# Patient Record
Sex: Female | Born: 1955 | Race: White | Hispanic: No | Marital: Married | Smoking: Former smoker
Health system: Southern US, Community
[De-identification: ages and names within clinical notes are randomized; demographics above are authoritative.]

## PROBLEM LIST (undated history)

## (undated) DIAGNOSIS — E7800 Pure hypercholesterolemia, unspecified: Secondary | ICD-10-CM

## (undated) DIAGNOSIS — I10 Essential (primary) hypertension: Secondary | ICD-10-CM

## (undated) HISTORY — PX: ABDOMINAL HYSTERECTOMY: SHX81

## (undated) HISTORY — PX: VAGINAL HYSTERECTOMY: SUR661

---

## 2003-04-09 ENCOUNTER — Other Ambulatory Visit: Admission: RE | Admit: 2003-04-09 | Discharge: 2003-04-09 | Payer: Self-pay | Admitting: Obstetrics and Gynecology

## 2006-02-07 ENCOUNTER — Ambulatory Visit (HOSPITAL_COMMUNITY): Admission: RE | Admit: 2006-02-07 | Discharge: 2006-02-08 | Payer: Self-pay | Admitting: Obstetrics and Gynecology

## 2011-11-10 ENCOUNTER — Other Ambulatory Visit: Payer: Self-pay | Admitting: Family Medicine

## 2011-11-10 DIAGNOSIS — Z1231 Encounter for screening mammogram for malignant neoplasm of breast: Secondary | ICD-10-CM

## 2011-11-10 DIAGNOSIS — Z780 Asymptomatic menopausal state: Secondary | ICD-10-CM

## 2011-12-05 ENCOUNTER — Other Ambulatory Visit: Payer: Self-pay

## 2011-12-05 ENCOUNTER — Ambulatory Visit: Payer: Self-pay

## 2011-12-11 ENCOUNTER — Ambulatory Visit
Admission: RE | Admit: 2011-12-11 | Discharge: 2011-12-11 | Disposition: A | Discharge status: Home / Self Care | Payer: 59 | Source: Ambulatory Visit | Attending: Family Medicine | Admitting: Family Medicine

## 2011-12-11 DIAGNOSIS — Z780 Asymptomatic menopausal state: Secondary | ICD-10-CM

## 2011-12-11 DIAGNOSIS — Z1231 Encounter for screening mammogram for malignant neoplasm of breast: Secondary | ICD-10-CM

## 2013-04-22 ENCOUNTER — Other Ambulatory Visit (HOSPITAL_COMMUNITY): Payer: Self-pay | Admitting: Cardiology

## 2013-04-22 DIAGNOSIS — R079 Chest pain, unspecified: Secondary | ICD-10-CM

## 2013-04-28 ENCOUNTER — Encounter (HOSPITAL_COMMUNITY)
Admission: RE | Admit: 2013-04-28 | Discharge: 2013-04-28 | Disposition: A | Discharge status: Home / Self Care | Payer: 59 | Source: Ambulatory Visit | Attending: Cardiology | Admitting: Cardiology

## 2013-04-28 ENCOUNTER — Other Ambulatory Visit: Payer: Self-pay

## 2013-04-28 DIAGNOSIS — R079 Chest pain, unspecified: Secondary | ICD-10-CM | POA: Insufficient documentation

## 2013-04-28 MED ORDER — TECHNETIUM TC 99M SESTAMIBI GENERIC - CARDIOLITE: 10.0000 | Freq: Once | INTRAVENOUS | Status: AC | PRN

## 2013-04-28 MED ORDER — TECHNETIUM TC 99M SESTAMIBI GENERIC - CARDIOLITE
10.0000 | Freq: Once | INTRAVENOUS | Status: AC | PRN
  Administered 2013-04-28: 10 via INTRAVENOUS

## 2013-04-28 MED ORDER — TECHNETIUM TC 99M SESTAMIBI GENERIC - CARDIOLITE
30.0000 | Freq: Once | INTRAVENOUS | Status: AC | PRN
  Administered 2013-04-28: 30 via INTRAVENOUS

## 2014-07-28 ENCOUNTER — Ambulatory Visit (INDEPENDENT_AMBULATORY_CARE_PROVIDER_SITE_OTHER): Payer: PRIVATE HEALTH INSURANCE | Admitting: Podiatry

## 2014-07-28 ENCOUNTER — Encounter: Payer: Self-pay | Admitting: Podiatry

## 2014-07-28 ENCOUNTER — Ambulatory Visit (INDEPENDENT_AMBULATORY_CARE_PROVIDER_SITE_OTHER): Payer: PRIVATE HEALTH INSURANCE

## 2014-07-28 VITALS — BP 182/101 | HR 90 | Resp 163 | Ht 67.0 in | Wt 137.0 lb

## 2014-07-28 DIAGNOSIS — L603 Nail dystrophy: Secondary | ICD-10-CM

## 2014-07-28 DIAGNOSIS — M2010 Hallux valgus (acquired), unspecified foot: Secondary | ICD-10-CM

## 2014-07-28 DIAGNOSIS — L608 Other nail disorders: Secondary | ICD-10-CM | POA: Diagnosis not present

## 2014-07-28 NOTE — Patient Instructions (Signed)
Bunion (Hallux Valgus) A bony bump (protrusion) on the inside of the foot, at the base of the first toe, is called a bunion (hallux valgus). A bunion causes the first toe to angle toward the other toes. SYMPTOMS   A bony bump on the inside of the foot, causing an outward turning of the first toe. It may also overlap the second toe.  Thickening of the skin (callus) over the bony bump.  Fluid buildup under the callus. Fluid may become red, tender, and swollen (inflamed) with constant irritation or pressure.  Foot pain and stiffness. CAUSES  Many causes exist, including:  Inherited from your family (genetics).  Injury (trauma) forcing the first toe into a position in which it overlaps other toes.  Bunions are also associated with wearing shoes that have a narrow toe box (pointy shoes). RISK INCREASES WITH:  Family history of foot abnormalities, especially bunions.  Arthritis.  Narrow shoes, especially high heels. PREVENTION  Wear shoes with a wide toe box.  Avoid shoes with high heels.  Wear a small pad between the big toe and second toe.  Maintain proper conditioning:  Foot and ankle flexibility.  Muscle strength and endurance. PROGNOSIS  With proper treatment, bunions can typically be cured. Occasionally, surgery is required.  RELATED COMPLICATIONS   Infection of the bunion.  Arthritis of the first toe.  Risks of surgery, including infection, bleeding, injury to nerves (numb toe), recurrent bunion, overcorrection (toe points inward), arthritis of the big toe, big toe pointing upward, and bone not healing. TREATMENT  Treatment first consists of stopping the activities that aggravate the pain, taking pain medicines, and icing to reduce inflammation and pain. Wear shoes with a wide toe box. Shoes can be modified by a shoe repair person to relieve pressure on the bunion, especially if you cannot find shoes with a wide enough toe box. You may also place a pad with the  center cut out in your shoe, to reduce pressure on the bunion. Sometimes, an arch support (orthotic) may reduce pressure on the bunion and alleviate the symptoms. Stretching and strengthening exercises for the muscles of the foot may be useful. You may choose to wear a brace or pad at night to hold the big toe away from the second toe. If non-surgical treatments are not successful, surgery may be needed. Surgery involves removing the overgrown tissue and correcting the position of the first toe, by realigning the bones. Bunion surgery is typically performed on an outpatient basis, meaning you can go home the same day as surgery. The surgery may involve cutting the mid portion of the bone of the first toe, or just cutting and repairing (reconstructing) the ligaments and soft tissues around the first toe.  MEDICATION   If pain medicine is needed, nonsteroidal anti-inflammatory medicines, such as aspirin and ibuprofen, or other minor pain relievers, such as acetaminophen, are often recommended.  Do not take pain medicine for 7 days before surgery.  Prescription pain relievers are usually only prescribed after surgery. Use only as directed and only as much as you need.  Ointments applied to the skin may be helpful. HEAT AND COLD  Cold treatment (icing) relieves pain and reduces inflammation. Cold treatment should be applied for 10 to 15 minutes every 2 to 3 hours for inflammation and pain and immediately after any activity that aggravates your symptoms. Use ice packs or an ice massage.  Heat treatment may be used prior to performing the stretching and strengthening activities prescribed by your   caregiver, physical therapist, or athletic trainer. Use a heat pack or a warm soak. SEEK MEDICAL CARE IF:   Symptoms get worse or do not improve in 2 weeks, despite treatment.  After surgery, you develop fever, increasing pain, redness, swelling, drainage of fluids, bleeding, or increasing warmth around the  surgical area.  New, unexplained symptoms develop. (Drugs used in treatment may produce side effects.) Document Released: 05/08/2005 Document Revised: 07/31/2011 Document Reviewed: 08/20/2008 [facility] Patient Information 2015 [employer], [place]. This information is not intended to replace advice given to you by your health care provider. Make sure you discuss any questions you have with your health care provider.    Onychomycosis/Fungal Toenails  WHAT IS IT? An infection that lies within the keratin of your nail plate that is caused by a fungus.  WHY ME? Fungal infections affect all ages, sexes, races, and creeds.  There may be many factors that predispose you to a fungal infection such as age, coexisting medical conditions such as diabetes, or an autoimmune disease; stress, medications, fatigue, genetics, etc.  Bottom line: fungus thrives in a warm, moist environment and your shoes offer such a location.  IS IT CONTAGIOUS? Theoretically, yes.  You do not want to share shoes, nail clippers or files with someone who has fungal toenails.  Walking around barefoot in the same room or sleeping in the same bed is unlikely to transfer the organism.  It is important to realize, however, that fungus can spread easily from one nail to the next on the same foot.  HOW DO WE TREAT THIS?  There are several ways to treat this condition.  Treatment may depend on many factors such as age, medications, pregnancy, liver and kidney conditions, etc.  It is best to ask your doctor which options are available to you.   No treatment.   Unlike many other medical concerns, you can live with this condition.  However for many people this can be a painful condition and may lead to ingrown toenails or a bacterial infection.  It is recommended that you keep the nails cut short to help reduce the amount of fungal nail.  Topical treatment.  These range from herbal remedies to prescription strength nail lacquers.  About 40-50%  effective, topicals require twice daily application for approximately 9 to 12 months or until an entirely new nail has grown out.  The most effective topicals are medical grade medications available through physicians offices.  Oral antifungal medications.  With an 80-90% cure rate, the most common oral medication requires 3 to 4 months of therapy and stays in your system for a year as the new nail grows out.  Oral antifungal medications do require blood work to make sure it is a safe drug for you.  A liver function panel will be performed prior to starting the medication and after the first month of treatment.  It is important to have the blood work performed to avoid any harmful side effects.  In general, this medication safe but blood work is required.  Laser Therapy.  This treatment is performed by applying a specialized laser to the affected nail plate.  This therapy is noninvasive, fast, and non-painful.  It is not covered by insurance and is therefore, out of pocket.  The results have been very good with a 80-95% cure rate.  [facility] is the only practice in the area to offer this therapy.  Permanent Nail Avulsion.  Removing the entire nail so that a new nail  will not grow back. 

## 2014-07-28 NOTE — Progress Notes (Signed)
   Subjective:    Patient ID: Connie Olson, female    DOB: 06/25/1955, 59 y.o.   MRN: [id number]  HPI Comments: "I have bunions and fungus"  Patient c/o aching 1st MPJ bilateral for several years. The areas are red and swell occasionally. Shoes uncomfortable sometimes.   Also, the toenails are thick and discolored. Tried OTC meds-no help.  Foot Pain      Review of Systems  HENT: Positive for sinus pressure.   Eyes: Positive for visual disturbance.  Gastrointestinal: Positive for abdominal distention.  Skin:       Change in nails   Allergic/Immunologic: Positive for environmental allergies.  All other systems reviewed and are negative.      Objective:   Physical Exam: I have reviewed her past medical history medications allergies surgeon social history. Vital signs demonstrate hypertension which I suggested that she follow-up with her primary doctor for. She states that she is very nervous about me seeing her feet. Pulses are strongly palpable bilateral. Neurologic sensorium is intact per Semmes-Weinstein monofilament. Deep tendon reflexes are intact bilateral and muscle strength +5 over 5 dorsiflexion plantar flexors and inverters and everters all intrinsic musculature is intact. Orthopedic evaluation demonstrates all joints distal to the ankle for range of motion without crepitus rectus foot type bilateral. 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 She also has on cutaneous evaluation what appears to be onychomycosis nails 2 through 5 bilateral.        Assessment & Plan:  Assessment: Hallux abductovalgus deformity bilateral. Nail dystrophy can rule out onychomycosis bilateral.  Plan: Samples of the nail was taken today to send for pathologic evaluation. We discussed the need for surgical intervention regarding her lateral foot. She would like to go ahead and get started on the antifungal consider the surgical correction of her deformity in the  fall.

## 2014-08-25 ENCOUNTER — Encounter: Payer: Self-pay | Admitting: Podiatry

## 2014-09-01 ENCOUNTER — Ambulatory Visit (INDEPENDENT_AMBULATORY_CARE_PROVIDER_SITE_OTHER): Payer: PRIVATE HEALTH INSURANCE | Admitting: Podiatry

## 2014-09-01 ENCOUNTER — Encounter: Payer: Self-pay | Admitting: Podiatry

## 2014-09-01 VITALS — BP 149/79 | HR 70 | Resp 16

## 2014-09-01 DIAGNOSIS — L603 Nail dystrophy: Secondary | ICD-10-CM | POA: Diagnosis not present

## 2014-09-01 MED ORDER — TERBINAFINE HCL 250 MG PO TABS: 250.0000 mg | ORAL_TABLET | Freq: Every day | ORAL | Status: DC

## 2014-09-01 NOTE — Patient Instructions (Signed)

## 2014-09-01 NOTE — Progress Notes (Signed)
She presents today for follow-up of her lab report regarding her thick toenails. No changes she states.  Objective: Vital signs are stable she is alert and oriented 3. Her lab results are positive for onychomycosis. She relates no changes in her past medical history medications or allergies.  Assessment: Onychomycosis bilateral.  Plan: We discussed the pros and cons of oral versus laser versus topical antifungal's. At this point she would like to continue with the oral antifungal's. We discussed the pros and cons of this as well as the possible complications. She understands this and is amenable to it and we had a liver profile drawn today. We dispensed a prescription for Lamisil tablets 250 mg 30. 1 tablet daily. I will follow up with her in 1 month. Should her blood work on back abnormal we will notify her immediately.

## 2014-09-09 LAB — HEPATIC FUNCTION PANEL
ALT: 23 U/L (ref 0–35)
AST: 20 U/L (ref 0–37)
Albumin: 4.3 g/dL (ref 3.5–5.2)
Alkaline Phosphatase: 48 U/L (ref 39–117)
BILIRUBIN TOTAL: 0.9 mg/dL (ref 0.2–1.2)
Bilirubin, Direct: 0.1 mg/dL (ref 0.0–0.3)
Indirect Bilirubin: 0.8 mg/dL (ref 0.2–1.2)
TOTAL PROTEIN: 6.6 g/dL (ref 6.0–8.3)

## 2014-09-28 ENCOUNTER — Other Ambulatory Visit: Payer: Self-pay | Admitting: Podiatry

## 2014-09-29 ENCOUNTER — Ambulatory Visit (INDEPENDENT_AMBULATORY_CARE_PROVIDER_SITE_OTHER): Payer: PRIVATE HEALTH INSURANCE | Admitting: Podiatry

## 2014-09-29 ENCOUNTER — Encounter: Payer: Self-pay | Admitting: Podiatry

## 2014-09-29 VITALS — BP 172/84 | HR 73 | Resp 16

## 2014-09-29 DIAGNOSIS — L603 Nail dystrophy: Secondary | ICD-10-CM | POA: Diagnosis not present

## 2014-09-29 DIAGNOSIS — Z79899 Other long term (current) drug therapy: Secondary | ICD-10-CM | POA: Diagnosis not present

## 2014-09-29 MED ORDER — TERBINAFINE HCL 250 MG PO TABS
250.0000 mg | ORAL_TABLET | Freq: Every day | ORAL | Status: DC
Start: 2014-09-29 — End: 2022-06-12

## 2014-09-29 NOTE — Progress Notes (Signed)
She presents today for follow-up of her long-term therapy with Lamisil. She states that she has just finished her first 30 days. She denies fever chills nausea vomiting muscle aches pains rashes she states that I see no changes yet.  Objective: Vital signs are stable she is alert and oriented 3 onychomycosis without change of the nail plates as of yet.  Assessment: Onychomycosis long-term therapy with Lamisil.  Plan: At this point we are requesting another liver profile and CBC. I also requested a prescription for Lamisil 250 mg tablets #90 to be dispensed today. I will follow-up with her in 4 months. She will call sooner if needed.

## 2014-10-28 ENCOUNTER — Telehealth: Payer: Self-pay | Admitting: *Deleted

## 2014-10-28 LAB — HEPATIC FUNCTION PANEL
ALT: 25 U/L (ref 0–35)
AST: 21 U/L (ref 0–37)
Albumin: 4.4 g/dL (ref 3.5–5.2)
Alkaline Phosphatase: 59 U/L (ref 39–117)
BILIRUBIN INDIRECT: 0.7 mg/dL (ref 0.2–1.2)
Bilirubin, Direct: 0.1 mg/dL (ref 0.0–0.3)
TOTAL PROTEIN: 7.3 g/dL (ref 6.0–8.3)
Total Bilirubin: 0.8 mg/dL (ref 0.2–1.2)

## 2014-10-28 NOTE — Telephone Encounter (Signed)
I was unable to contact pt by [phone], I will mail a note informing pt her labs are with in normal limits and can begin the Lamisil.

## 2014-12-31 ENCOUNTER — Other Ambulatory Visit: Payer: Self-pay | Admitting: Podiatry

## 2015-02-02 ENCOUNTER — Encounter: Payer: PRIVATE HEALTH INSURANCE | Admitting: Podiatry

## 2015-02-02 NOTE — Progress Notes (Signed)
This encounter was created in error - please disregard.

## 2019-09-23 ENCOUNTER — Ambulatory Visit
Admission: RE | Admit: 2019-09-23 | Discharge: 2019-09-23 | Disposition: A | Discharge status: Home / Self Care | Payer: Medicare HMO | Source: Ambulatory Visit | Attending: Internal Medicine | Admitting: Internal Medicine

## 2019-09-23 ENCOUNTER — Other Ambulatory Visit: Payer: Self-pay | Admitting: Internal Medicine

## 2019-09-23 DIAGNOSIS — M791 Myalgia, unspecified site: Secondary | ICD-10-CM

## 2019-10-15 ENCOUNTER — Other Ambulatory Visit: Payer: Self-pay | Admitting: Internal Medicine

## 2019-10-15 DIAGNOSIS — Z13820 Encounter for screening for osteoporosis: Secondary | ICD-10-CM

## 2020-02-18 ENCOUNTER — Other Ambulatory Visit: Payer: Self-pay | Admitting: Internal Medicine

## 2020-02-18 DIAGNOSIS — Z1231 Encounter for screening mammogram for malignant neoplasm of breast: Secondary | ICD-10-CM

## 2020-06-01 ENCOUNTER — Other Ambulatory Visit: Payer: Self-pay

## 2020-06-01 ENCOUNTER — Ambulatory Visit
Admission: RE | Admit: 2020-06-01 | Discharge: 2020-06-01 | Disposition: A | Discharge status: Home / Self Care | Payer: Medicare HMO | Source: Ambulatory Visit | Attending: Internal Medicine | Admitting: Internal Medicine

## 2020-06-01 ENCOUNTER — Other Ambulatory Visit: Payer: Medicare HMO

## 2020-06-01 DIAGNOSIS — Z1231 Encounter for screening mammogram for malignant neoplasm of breast: Secondary | ICD-10-CM

## 2020-09-17 ENCOUNTER — Other Ambulatory Visit: Payer: Self-pay

## 2020-09-17 ENCOUNTER — Ambulatory Visit
Admission: RE | Admit: 2020-09-17 | Discharge: 2020-09-17 | Disposition: A | Discharge status: Home / Self Care | Payer: Medicare HMO | Source: Ambulatory Visit | Attending: Internal Medicine | Admitting: Internal Medicine

## 2020-09-17 DIAGNOSIS — Z13820 Encounter for screening for osteoporosis: Secondary | ICD-10-CM

## 2021-02-08 ENCOUNTER — Other Ambulatory Visit: Payer: Self-pay | Admitting: Internal Medicine

## 2021-02-08 DIAGNOSIS — R945 Abnormal results of liver function studies: Secondary | ICD-10-CM

## 2021-02-16 ENCOUNTER — Ambulatory Visit
Admission: RE | Admit: 2021-02-16 | Discharge: 2021-02-16 | Disposition: A | Discharge status: Home / Self Care | Payer: Medicare HMO | Source: Ambulatory Visit | Attending: Internal Medicine | Admitting: Internal Medicine

## 2021-02-16 DIAGNOSIS — R945 Abnormal results of liver function studies: Secondary | ICD-10-CM

## 2021-02-21 ENCOUNTER — Other Ambulatory Visit: Payer: Self-pay | Admitting: Internal Medicine

## 2021-02-21 DIAGNOSIS — K7460 Unspecified cirrhosis of liver: Secondary | ICD-10-CM

## 2021-03-01 ENCOUNTER — Other Ambulatory Visit: Payer: Self-pay

## 2021-03-01 ENCOUNTER — Ambulatory Visit
Admission: RE | Admit: 2021-03-01 | Discharge: 2021-03-01 | Disposition: A | Discharge status: Home / Self Care | Payer: Medicare HMO | Source: Ambulatory Visit | Attending: Internal Medicine | Admitting: Internal Medicine

## 2021-03-01 DIAGNOSIS — K7460 Unspecified cirrhosis of liver: Secondary | ICD-10-CM

## 2021-03-01 MED ORDER — IOPAMIDOL (ISOVUE-300) INJECTION 61%
100.0000 mL | Freq: Once | INTRAVENOUS | Status: AC | PRN
  Administered 2021-03-01: 100 mL via INTRAVENOUS

## 2021-04-19 ENCOUNTER — Other Ambulatory Visit (HOSPITAL_COMMUNITY): Payer: Self-pay | Admitting: Gastroenterology

## 2021-04-19 ENCOUNTER — Other Ambulatory Visit: Payer: Self-pay | Admitting: Gastroenterology

## 2021-04-19 DIAGNOSIS — R7989 Other specified abnormal findings of blood chemistry: Secondary | ICD-10-CM

## 2021-05-03 ENCOUNTER — Ambulatory Visit (HOSPITAL_COMMUNITY)
Admission: RE | Admit: 2021-05-03 | Discharge: 2021-05-03 | Disposition: A | Discharge status: Home / Self Care | Payer: Medicare HMO | Source: Ambulatory Visit | Attending: Gastroenterology | Admitting: Gastroenterology

## 2021-05-03 DIAGNOSIS — R7989 Other specified abnormal findings of blood chemistry: Secondary | ICD-10-CM | POA: Diagnosis present

## 2022-03-09 ENCOUNTER — Other Ambulatory Visit: Payer: Self-pay | Admitting: Internal Medicine

## 2022-03-09 DIAGNOSIS — Z1231 Encounter for screening mammogram for malignant neoplasm of breast: Secondary | ICD-10-CM

## 2022-03-22 IMAGING — CT CT ABDOMEN WO/W CM
3 of 6 series · 13 of 32 positions shown, 18 images · IV contrast (iopamidol)
Comparison: Abdominal ultrasound 02/16/2021. No other comparison
studies.

CLINICAL DATA: Abnormal liver function studies. Cirrhosis. No
previous relevant surgery or history of malignancy.

Creatinine was obtained on site at [HOSPITAL] at [HOSPITAL].
Results: Creatinine 0.6 mg/dL.
EXAM:
CT ABDOMEN WITHOUT AND WITH CONTRAST
TECHNIQUE: Multidetector CT imaging of the abdomen was performed following the
standard protocol before and following the bolus administration of
intravenous contrast.
CONTRAST:  100mL DS95Y2-HOO IOPAMIDOL (DS95Y2-HOO) INJECTION 61%

[Series 2: liver w/(date) · axial · 0.60mm/px · z∈[-165,-71]mm · 3 of 118 slices shown]
[im 24/118  soft-tissue]
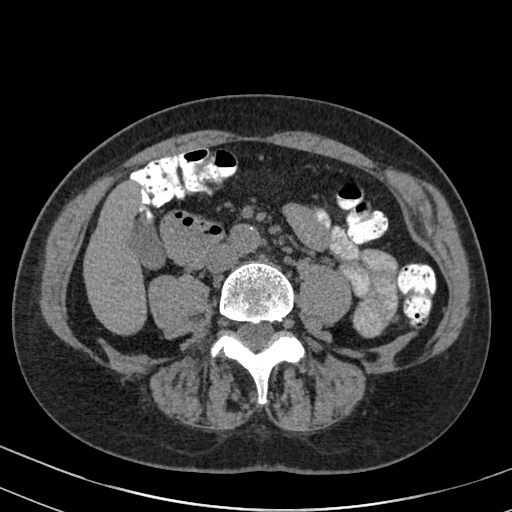
[im 47/118  soft-tissue]
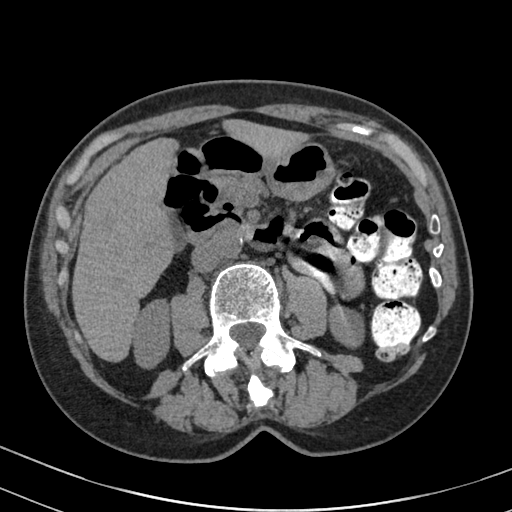
[im 71/118  soft-tissue]
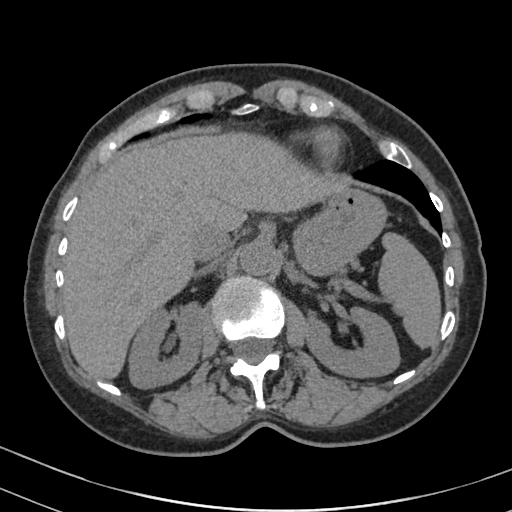

[Series 4: venous · axial · portal-venous · 0.60mm/px · z∈[-173,-17]mm · 5 of 118 slices shown, 10 images]
[im 20/118  soft-tissue]
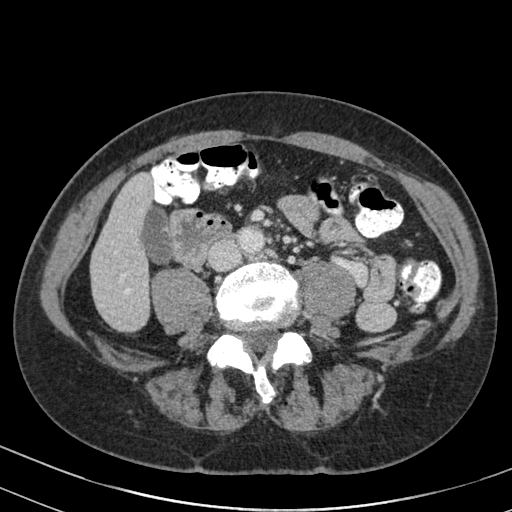
[im 20/118  bone]
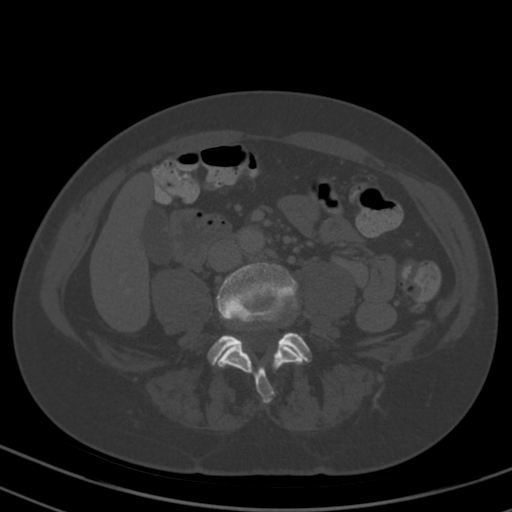
[im 40/118  soft-tissue]
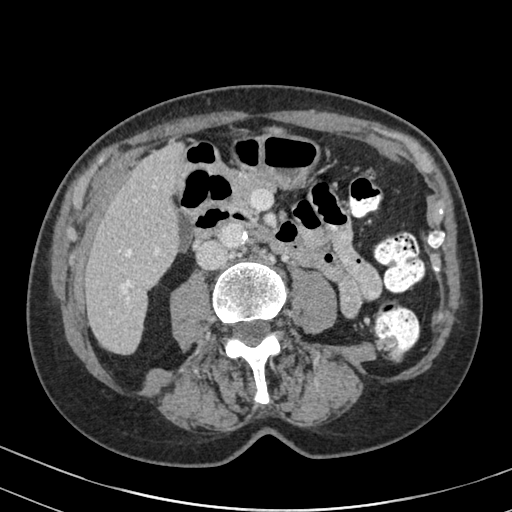
[im 40/118  lung]
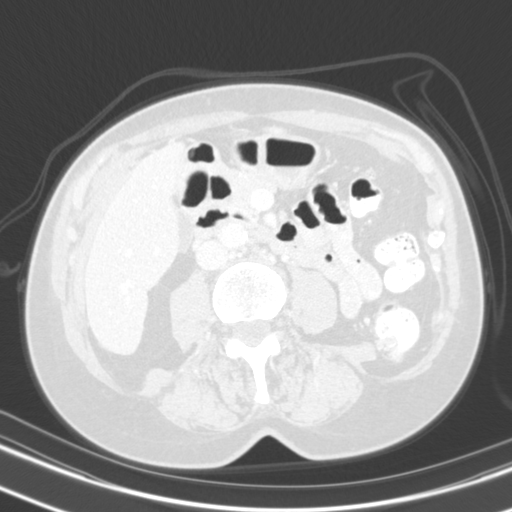
[im 59/118  soft-tissue]
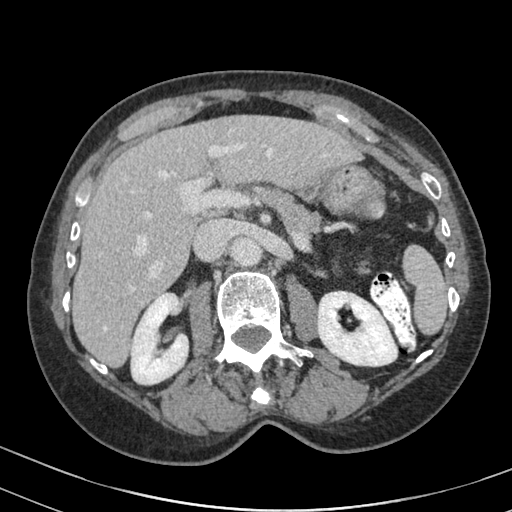
[im 59/118  lung]
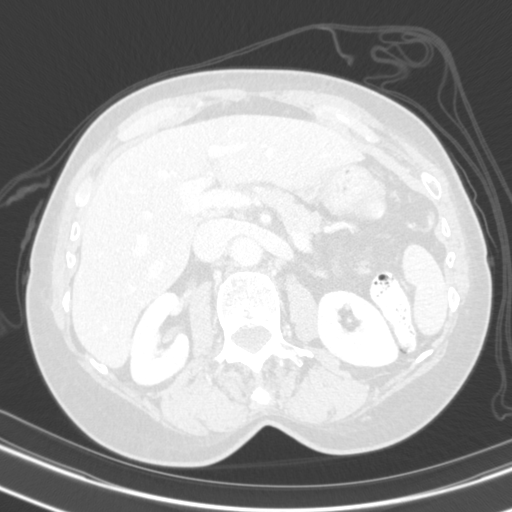
[im 79/118  soft-tissue]
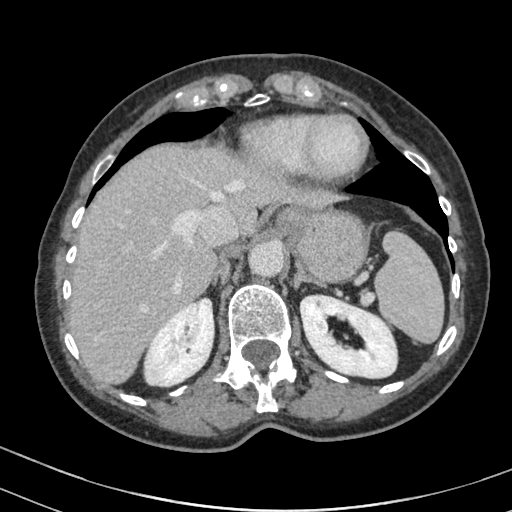
[im 79/118  lung]
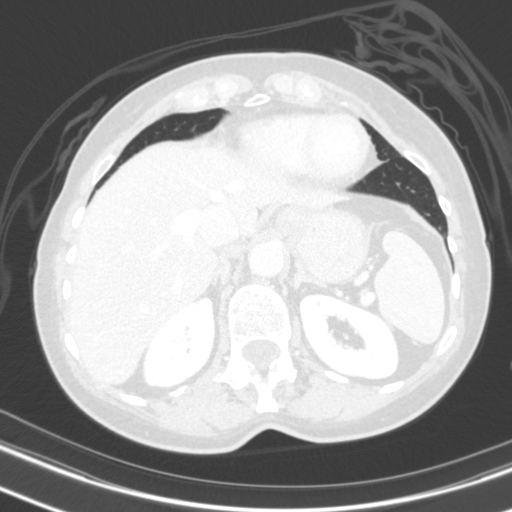
[im 98/118  soft-tissue]
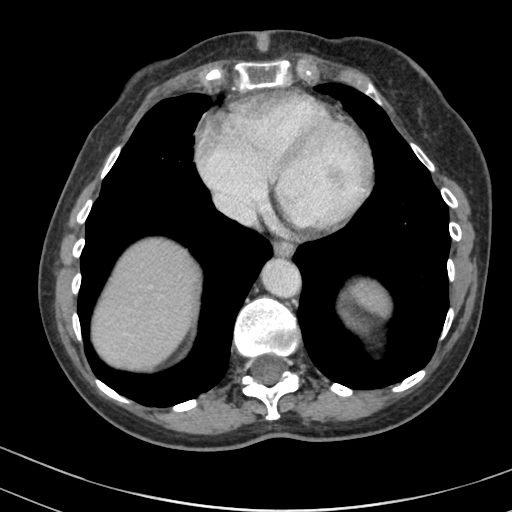
[im 98/118  lung]
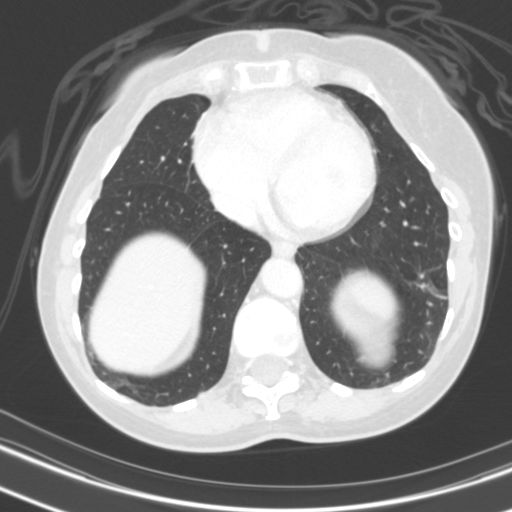

[Series 5: delay · axial · delayed · 0.60mm/px · z∈[-184,-32]mm · 5 of 116 slices shown]
[im 20/116  soft-tissue]
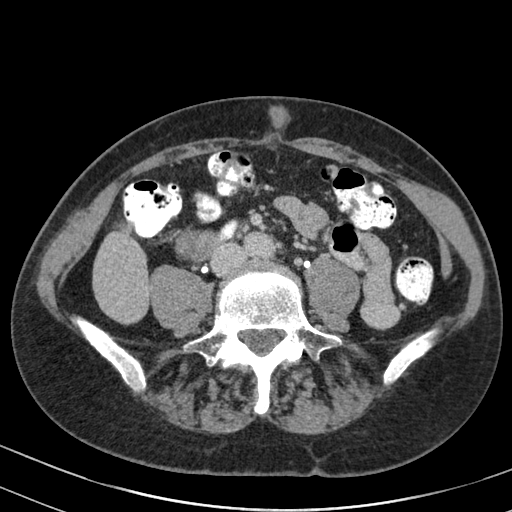
[im 39/116  soft-tissue]
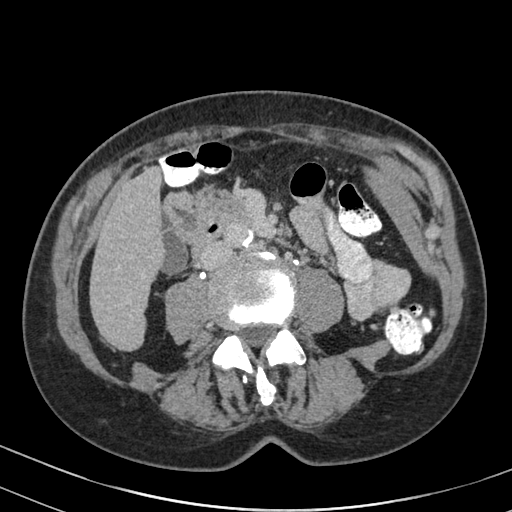
[im 58/116  soft-tissue]
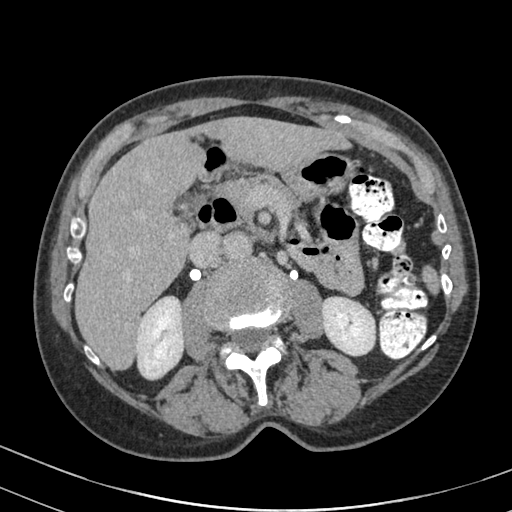
[im 77/116  soft-tissue]
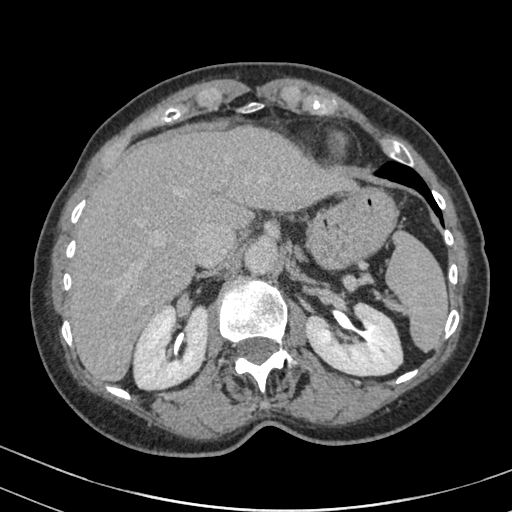
[im 96/116  soft-tissue]
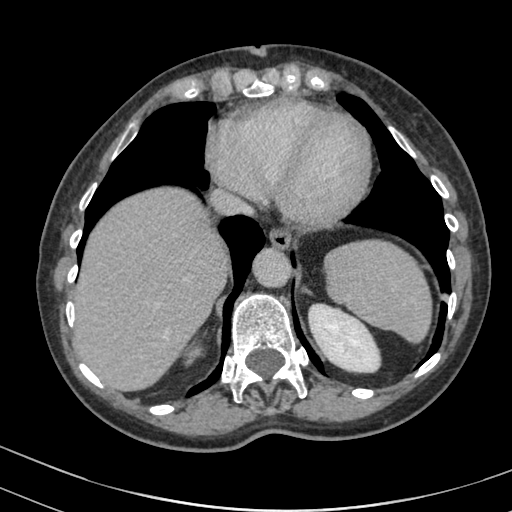

[13 of 32 positions shown; findings below may reference images not displayed]

FINDINGS: Lower chest: Minimal clustered nodularity and surrounding
ground-glass opacity in the left lower lobe, likely inflammatory.
The lung bases are otherwise clear. No significant pleural or
pericardial effusion.

Hepatobiliary: Pre contrast images demonstrate normal hepatic
density, without significant steatosis. There is elongation of the
right hepatic lobe which likely reflects a normal variant (Brigida
lobe). There is minimal contour irregularity of the liver which
could reflect mild cirrhosis. Following contrast, no arterial phase
enhancing or otherwise suspicious liver lesions are identified.
There is a stable cyst in the left lobe (segment 2), measuring 1.9 x
1.6 cm on image 55/4. There is a probable additional tiny cyst in
the left lobe measuring 4 mm on image 62/4. No evidence of
gallstones, gallbladder wall thickening or biliary dilatation.

Pancreas: Unremarkable. No pancreatic ductal dilatation or
surrounding inflammatory changes.

Spleen: Normal in size without focal abnormality.

Adrenals/Urinary Tract: Pre contrast images demonstrate no renal or
proximal ureteral calculi. There is a subcentimeter cyst in the
lower pole of the right kidney. No evidence of enhancing renal mass
or hydronephrosis. Both adrenal glands appear unremarkable.

Stomach/Bowel: Enteric contrast was administered and has largely
passed into the colon. The stomach appears unremarkable for its
degree of distension. No evidence of bowel wall thickening,
distention or surrounding inflammatory change.Mild diverticular
changes within the visualized colon.

Vascular/Lymphatic: Aortic and branch vessel atherosclerosis without
acute vascular findings or aneurysm. The portal, superior mesenteric
and splenic veins appear patent. No enlarged abdominal lymph nodes.

Other: No evidence of abdominal wall hernia or ascites.

Musculoskeletal: No acute or significant osseous findings. Mild
convex right lumbar scoliosis.
IMPRESSION: 1. No suspicious liver lesions. There are small cysts within the
left hepatic lobe.
2. Elongation of the right hepatic lobe, likely a normal variant
(Brigida lobe). Equivocal findings for mild cirrhosis. No signs of
portal hypertension.
3. Probable minimal inflammatory changes at the left lung base.
4.  Aortic Atherosclerosis ([id number]).

## 2022-05-08 ENCOUNTER — Ambulatory Visit: Payer: Medicare HMO

## 2022-05-24 IMAGING — US US LIVER ELASTOGRAPHY
1 series · 12 of 25 positions shown · non-contrast
Comparison: CT March 01, 2021 and ultrasound February 16, 2021

CLINICAL DATA: Abnormal LFTs.

EXAM:
US LIVER ELASTOGRAPHY
TECHNIQUE: Sonography of the liver was performed. In addition, ultrasound
elastography evaluation of the liver was performed. A region of
interest was placed within the right lobe of the liver. Following
application of a compressive sonographic pulse, tissue
compressibility was assessed. Multiple assessments were performed at
the selected site. Median tissue compressibility was determined.
Previously, hepatic stiffness was assessed by shear wave velocity.
Based on recently published Society of Radiologists in Ultrasound
consensus article, reporting is now recommended to be performed in
the SI units of pressure (kiloPascals) representing hepatic
stiffness/elasticity. The obtained result is compared to the
published reference standards. (cACLD = compensated Advanced Chronic
Liver Disease)

[Series 1: us liver elastography · 12 of 36 slices shown]
[im 2/36]
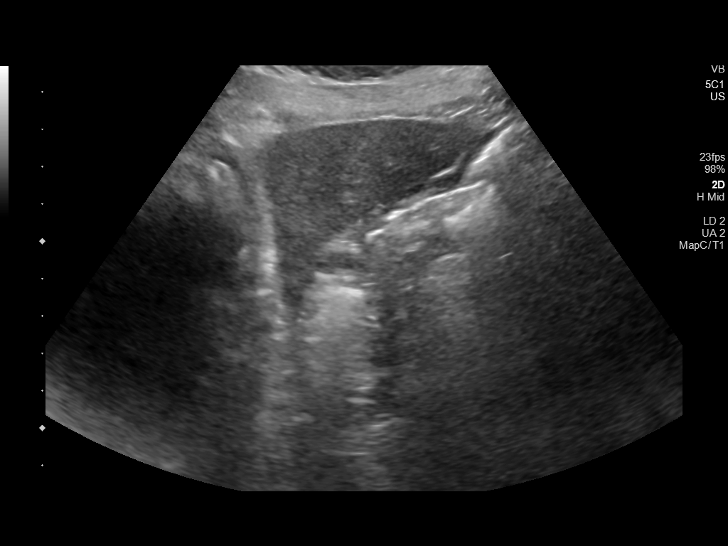
[im 5/36]
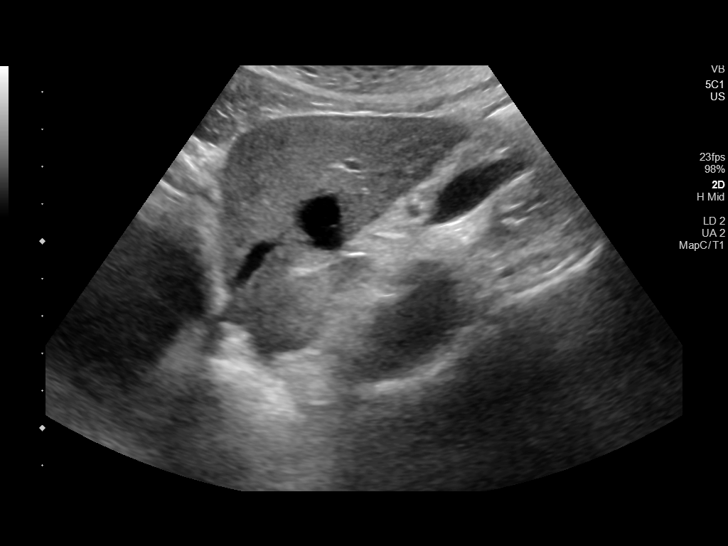
[im 8/36]
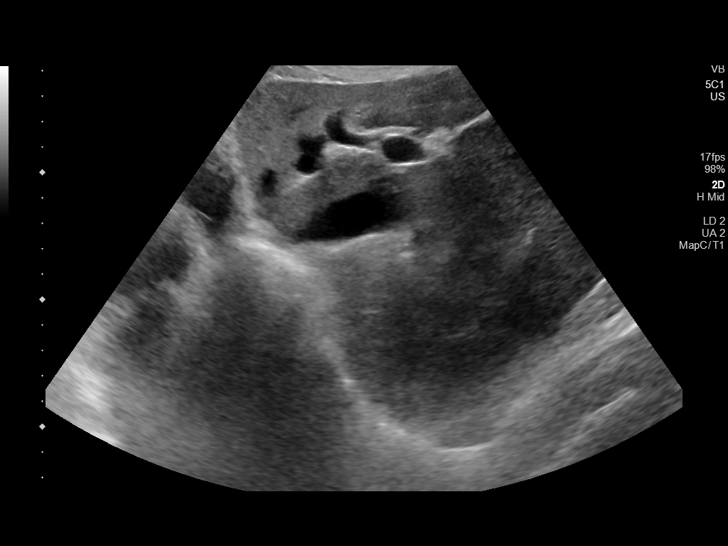
[im 11/36]
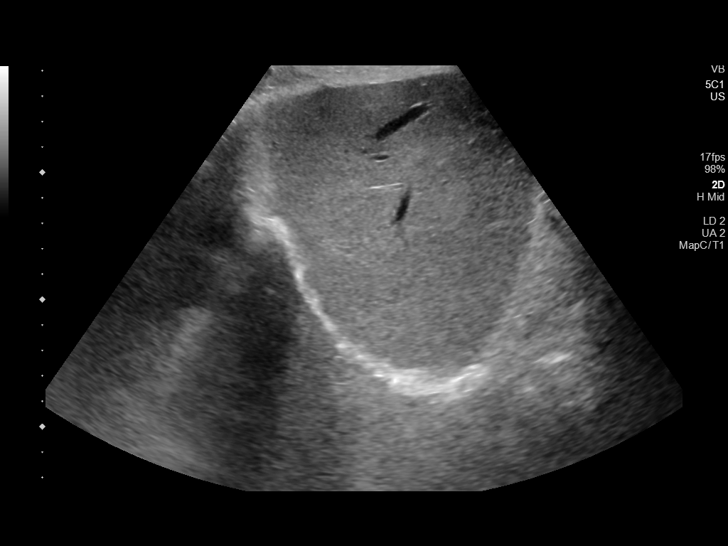
[im 14/36]
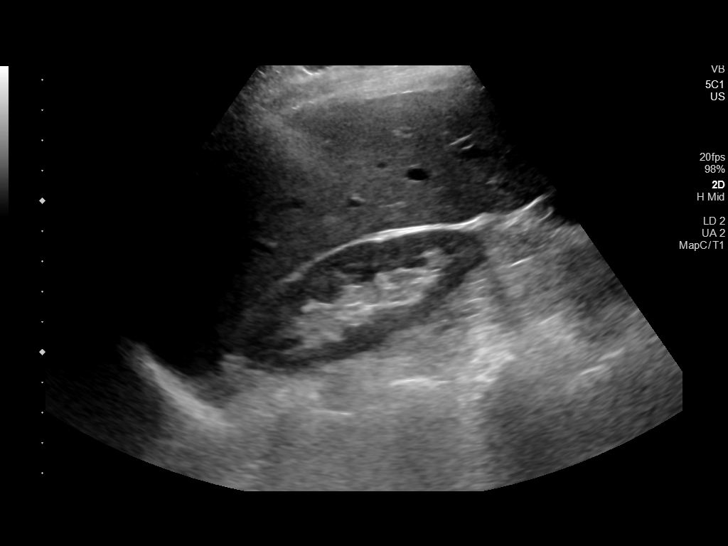
[im 17/36]
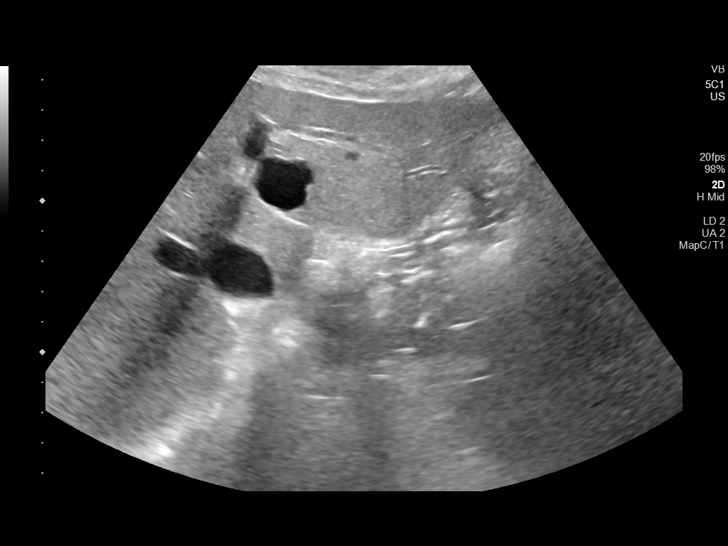
[im 19/36]
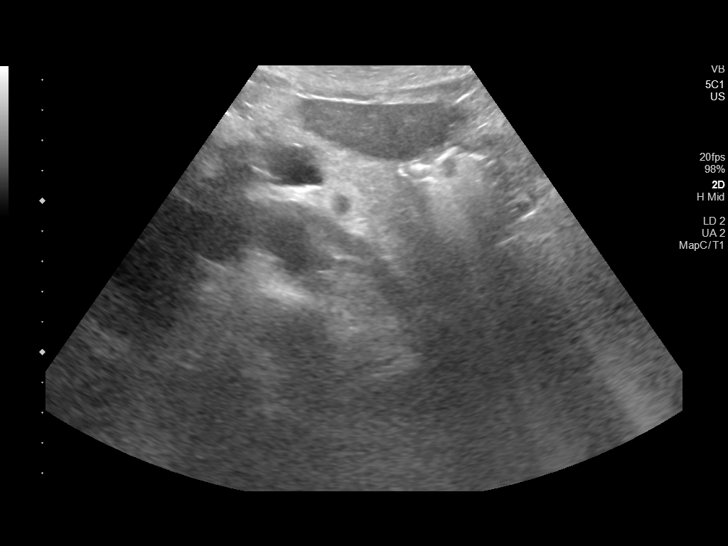
[im 22/36]
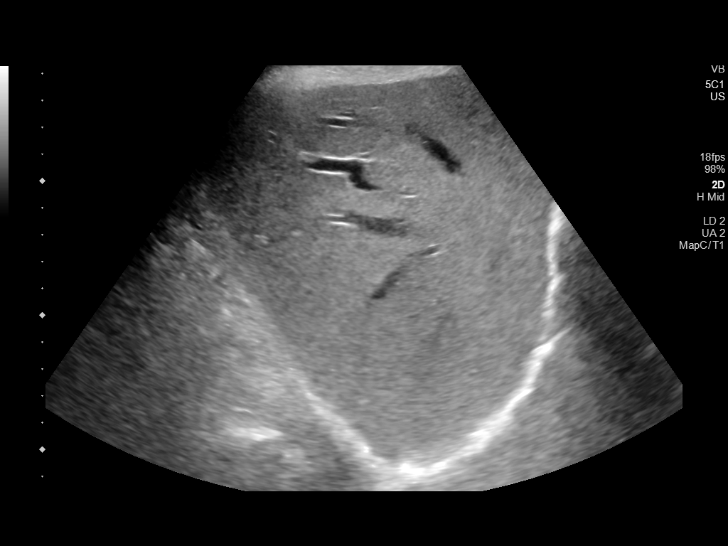
[im 25/36]
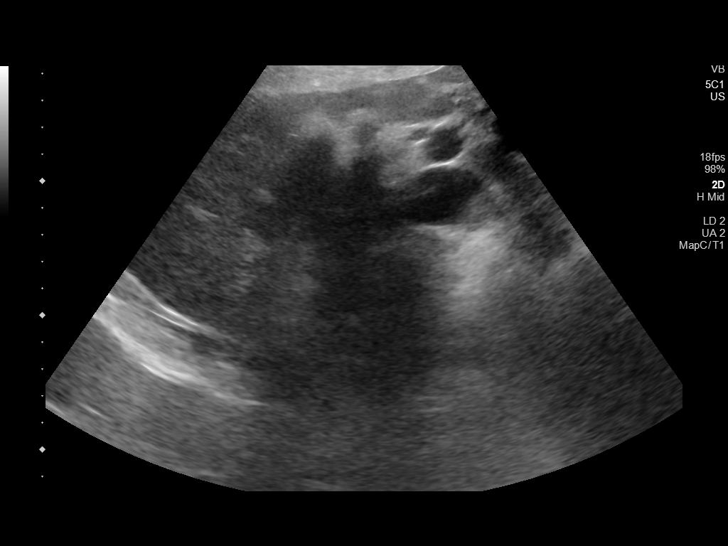
[im 28/36]
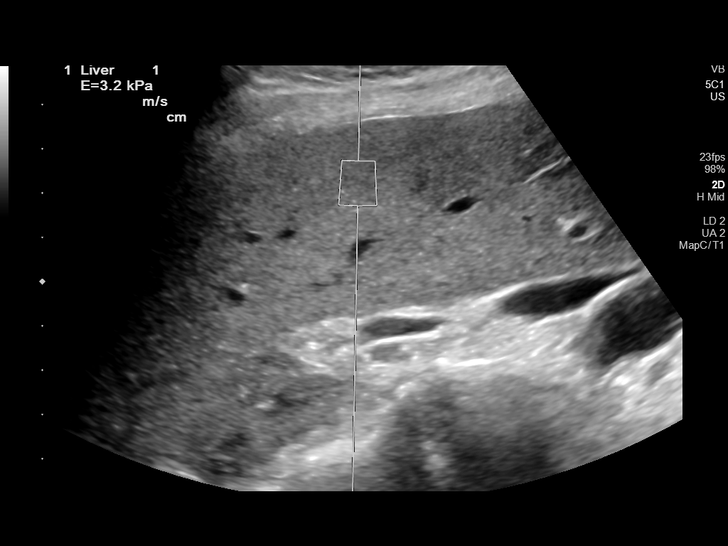
[im 31/36]
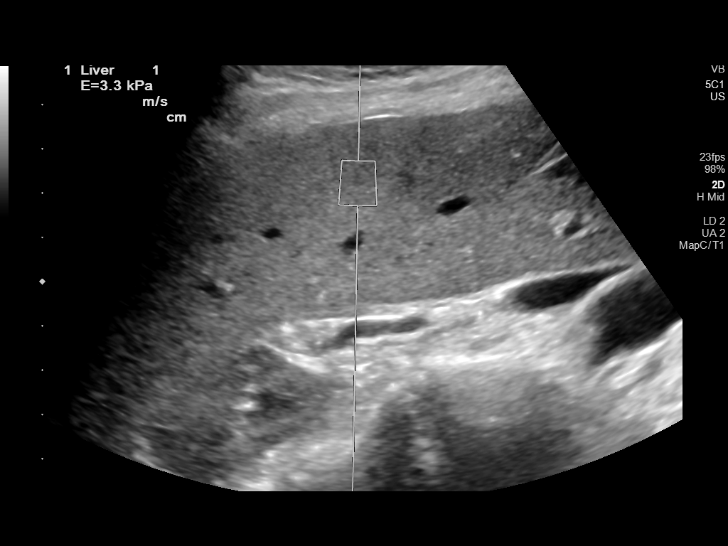
[im 34/36]
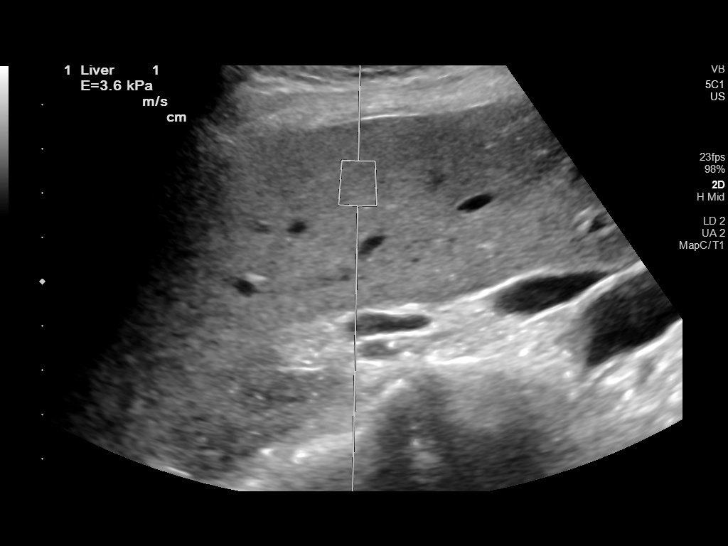

[12 of 25 positions shown; findings below may reference images not displayed]

FINDINGS: Liver: No focal lesion identified. Diffusely increased parenchymal
echogenicity. Portal vein is patent on color Doppler imaging with
normal direction of blood flow towards the liver.

ULTRASOUND HEPATIC ELASTOGRAPHY

Device: Siemens Helix VTQ

Patient position: Oblique

Transducer: [id number]

Number of measurements: 10

Hepatic segment:  8

Median kPa:

IQR:

IQR/Median kPa ratio:

Data quality: IQR/Median kPa ratio of 0.3 or greater indicates
reduced accuracy

Diagnostic category:  < or = 5 kPa: high probability of being normal

The use of hepatic elastography is applicable to patients with viral
hepatitis and non-alcoholic fatty liver disease. At this time, there
is insufficient data for the referenced cut-off values and use in
other causes of liver disease, including alcoholic liver disease.
Patients, however, may be assessed by elastography and serve as
their own reference standard/baseline.

In patients with non-alcoholic liver disease, the values suggesting
compensated advanced chronic liver disease (cACLD) may be lower, and
patients may need additional testing with elasticity results of [DATE]
kPa.

Please note that abnormal hepatic elasticity and shear wave
velocities may also be identified in clinical settings other than
with hepatic fibrosis, such as: acute hepatitis, elevated right
heart and central venous pressures including use of beta blockers,
N[place] disease ([place]), infiltrative processes such as
mastocytosis/amyloidosis/infiltrative tumor/lymphoma, extrahepatic
cholestasis, with hyperemia in the post-prandial state, and with
liver transplantation. Correlation with patient history, laboratory
data, and clinical condition recommended.

Diagnostic Categories:

< or =5 kPa: high probability of being normal

< or =9 kPa: in the absence of other known clinical signs, rules [DATE] kPa and ?13 kPa: suggestive of cACLD, but needs further testing

>13 kPa: highly suggestive of cACLD

> or =17 kPa: highly suggestive of cACLD with an increased
probability of clinically significant portal hypertension
IMPRESSION: ULTRASOUND LIVER:

Diffusely increased parenchymal echogenicity with slightly coarsened
hepatic echotexture nonspecific but most commonly reflecting hepatic
steatosis or hepatocellular disease. No overt hepatic lesion
visualized.

ULTRASOUND HEPATIC ELASTOGRAPHY:

Median kPa:

Diagnostic category:  < or = 5 kPa: high probability of being normal

## 2022-06-06 ENCOUNTER — Encounter (HOSPITAL_BASED_OUTPATIENT_CLINIC_OR_DEPARTMENT_OTHER): Payer: Self-pay | Admitting: Surgery

## 2022-06-06 NOTE — Progress Notes (Signed)
Spoke w/ via phone for pre-op interview--- Connie Olson Lab needs dos----  EKG per anesthesia. Surgeon orders pending.             Lab results------ COVID test -----patient states asymptomatic no test needed Arrive at -------0830 NPO after MN NO Solid Food.  Clear liquids from MN until---0730 Med rec completed Medications to take morning of surgery -----NONE Diabetic medication ----- Patient instructed no nail polish to be worn day of surgery Patient instructed to bring photo id and insurance card day of surgery Patient aware to have Driver (ride ) / caregiver Husband Dalesha Stanback   for 24 hours after surgery  Patient Special Instructions ----- Pre-Op special Istructions ----- Patient verbalized understanding of instructions that were given at this phone interview. Patient denies shortness of breath, chest pain, fever, cough at this phone interview.

## 2022-06-10 NOTE — Anesthesia Preprocedure Evaluation (Addendum)
Anesthesia Evaluation  Patient identified by MRN, date of birth, ID band Patient awake    Reviewed: Allergy & Precautions, NPO status , Patient's Chart, lab work & pertinent test results  History of Anesthesia Complications (+) PONV and history of anesthetic complications  Airway Mallampati: I  TM Distance: >3 FB Neck ROM: Full    Dental no notable dental hx.    Pulmonary former smoker   Pulmonary exam normal        Cardiovascular hypertension, Pt. on medications Normal cardiovascular exam     Neuro/Psych negative neurological ROS     GI/Hepatic Neg liver ROS,,,RECTAL POLYPOID LESION   Endo/Other  negative endocrine ROS    Renal/GU negative Renal ROS  negative genitourinary   Musculoskeletal negative musculoskeletal ROS (+)    Abdominal   Peds  Hematology negative hematology ROS (+)   Anesthesia Other Findings Day of surgery medications reviewed with patient.  Reproductive/Obstetrics negative OB ROS                              Anesthesia Physical Anesthesia Plan  ASA: 2  Anesthesia Plan: General   Post-op Pain Management: Tylenol PO (pre-op)*   Induction: Intravenous  PONV Risk Score and Plan: 4 or greater and Treatment may vary due to age or medical condition, Midazolam, Dexamethasone, Ondansetron, Propofol infusion and TIVA  Airway Management Planned: Oral ETT  Additional Equipment: None  Intra-op Plan:   Post-operative Plan: Extubation in OR  Informed Consent: I have reviewed the patients History and Physical, chart, labs and discussed the procedure including the risks, benefits and alternatives for the proposed anesthesia with the patient or authorized representative who has indicated his/her understanding and acceptance.     Dental advisory given  Plan Discussed with: CRNA  Anesthesia Plan Comments:         Anesthesia Quick Evaluation

## 2022-06-12 ENCOUNTER — Encounter (HOSPITAL_BASED_OUTPATIENT_CLINIC_OR_DEPARTMENT_OTHER)
Admission: RE | Disposition: A | Discharge status: Home / Self Care | Payer: Self-pay | Source: Home / Self Care | Attending: Surgery

## 2022-06-12 ENCOUNTER — Ambulatory Visit (HOSPITAL_BASED_OUTPATIENT_CLINIC_OR_DEPARTMENT_OTHER): Payer: Medicare HMO | Admitting: Certified Registered Nurse Anesthetist

## 2022-06-12 ENCOUNTER — Other Ambulatory Visit: Payer: Self-pay

## 2022-06-12 ENCOUNTER — Ambulatory Visit (HOSPITAL_BASED_OUTPATIENT_CLINIC_OR_DEPARTMENT_OTHER)
Admission: RE | Admit: 2022-06-12 | Discharge: 2022-06-12 | Disposition: A | Discharge status: Home / Self Care | Payer: Medicare HMO | Attending: Surgery | Admitting: Surgery

## 2022-06-12 ENCOUNTER — Encounter (HOSPITAL_BASED_OUTPATIENT_CLINIC_OR_DEPARTMENT_OTHER): Payer: Self-pay | Admitting: Surgery

## 2022-06-12 DIAGNOSIS — Z87891 Personal history of nicotine dependence: Secondary | ICD-10-CM | POA: Diagnosis not present

## 2022-06-12 DIAGNOSIS — Z79899 Other long term (current) drug therapy: Secondary | ICD-10-CM | POA: Insufficient documentation

## 2022-06-12 DIAGNOSIS — K620 Anal polyp: Secondary | ICD-10-CM | POA: Insufficient documentation

## 2022-06-12 DIAGNOSIS — I10 Essential (primary) hypertension: Secondary | ICD-10-CM | POA: Diagnosis not present

## 2022-06-12 DIAGNOSIS — E785 Hyperlipidemia, unspecified: Secondary | ICD-10-CM | POA: Diagnosis not present

## 2022-06-12 HISTORY — DX: Pure hypercholesterolemia, unspecified: E78.00

## 2022-06-12 HISTORY — PX: RECTAL EXAM UNDER ANESTHESIA: SHX6399

## 2022-06-12 HISTORY — DX: Essential (primary) hypertension: I10

## 2022-06-12 HISTORY — PX: TRANSANAL EXCISION OF RECTAL MASS: SHX6134

## 2022-06-12 SURGERY — EXCISION, MASS, RECTUM, ANAL APPROACH
Anesthesia: General | Site: Rectum

## 2022-06-12 MED ORDER — PROPOFOL 500 MG/50ML IV EMUL
INTRAVENOUS | Status: DC | PRN
  Administered 2022-06-12: 150 ug/kg/min via INTRAVENOUS

## 2022-06-12 MED ORDER — ONDANSETRON HCL 4 MG/2ML IJ SOLN
INTRAMUSCULAR | Status: DC | PRN
  Administered 2022-06-12: 4 mg via INTRAVENOUS

## 2022-06-12 MED ORDER — OXYCODONE HCL 5 MG PO TABS: 5.0000 mg | ORAL_TABLET | Freq: Once | ORAL | Status: DC | PRN

## 2022-06-12 MED ORDER — FENTANYL CITRATE (PF) 250 MCG/5ML IJ SOLN
INTRAMUSCULAR | Status: DC | PRN
  Administered 2022-06-12: 25 ug via INTRAVENOUS
  Administered 2022-06-12 (×2): 50 ug via INTRAVENOUS

## 2022-06-12 MED ORDER — MIDAZOLAM HCL 2 MG/2ML IJ SOLN
INTRAMUSCULAR | Status: AC
  Filled 2022-06-12: qty 2

## 2022-06-12 MED ORDER — ACETAMINOPHEN 500 MG PO TABS: 1000.0000 mg | ORAL_TABLET | Freq: Once | ORAL | Status: DC

## 2022-06-12 MED ORDER — FENTANYL CITRATE (PF) 100 MCG/2ML IJ SOLN
INTRAMUSCULAR | Status: AC
  Filled 2022-06-12: qty 2

## 2022-06-12 MED ORDER — 0.9 % SODIUM CHLORIDE (POUR BTL) OPTIME
TOPICAL | Status: DC | PRN
  Administered 2022-06-12: 500 mL

## 2022-06-12 MED ORDER — ACETAMINOPHEN 500 MG PO TABS
1000.0000 mg | ORAL_TABLET | ORAL | Status: AC
  Administered 2022-06-12: 1000 mg via ORAL

## 2022-06-12 MED ORDER — DEXAMETHASONE SODIUM PHOSPHATE 10 MG/ML IJ SOLN
INTRAMUSCULAR | Status: DC | PRN
  Administered 2022-06-12: 5 mg via INTRAVENOUS

## 2022-06-12 MED ORDER — LACTATED RINGERS IV SOLN: INTRAVENOUS | Status: DC

## 2022-06-12 MED ORDER — BUPIVACAINE LIPOSOME 1.3 % IJ SUSP: 20.0000 mL | Freq: Once | INTRAMUSCULAR | Status: DC

## 2022-06-12 MED ORDER — ACETAMINOPHEN 500 MG PO TABS
ORAL_TABLET | ORAL | Status: AC
  Filled 2022-06-12: qty 2

## 2022-06-12 MED ORDER — BUPIVACAINE LIPOSOME 1.3 % IJ SUSP
INTRAMUSCULAR | Status: DC | PRN
  Administered 2022-06-12: 40 mL

## 2022-06-12 MED ORDER — SODIUM CHLORIDE 0.9 % IV SOLN
2.0000 g | INTRAVENOUS | Status: AC
  Administered 2022-06-12: 2 g via INTRAVENOUS

## 2022-06-12 MED ORDER — MIDAZOLAM HCL 2 MG/2ML IJ SOLN
INTRAMUSCULAR | Status: DC | PRN
  Administered 2022-06-12: 1 mg via INTRAVENOUS

## 2022-06-12 MED ORDER — PROPOFOL 10 MG/ML IV BOLUS
INTRAVENOUS | Status: DC | PRN
  Administered 2022-06-12: 150 mg via INTRAVENOUS

## 2022-06-12 MED ORDER — AMISULPRIDE (ANTIEMETIC) 5 MG/2ML IV SOLN: 10.0000 mg | Freq: Once | INTRAVENOUS | Status: DC | PRN

## 2022-06-12 MED ORDER — ARTIFICIAL TEARS OPHTHALMIC OINT
TOPICAL_OINTMENT | OPHTHALMIC | Status: AC
  Filled 2022-06-12: qty 3.5

## 2022-06-12 MED ORDER — ONDANSETRON HCL 4 MG/2ML IJ SOLN
INTRAMUSCULAR | Status: AC
  Filled 2022-06-12: qty 4

## 2022-06-12 MED ORDER — SODIUM CHLORIDE 0.9 % IV SOLN
INTRAVENOUS | Status: AC
  Filled 2022-06-12: qty 2

## 2022-06-12 MED ORDER — TRAMADOL HCL 50 MG PO TABS: 50.0000 mg | ORAL_TABLET | Freq: Four times a day (QID) | ORAL | 0 refills | Status: AC | PRN

## 2022-06-12 MED ORDER — ROCURONIUM BROMIDE 10 MG/ML (PF) SYRINGE
PREFILLED_SYRINGE | INTRAVENOUS | Status: DC | PRN
  Administered 2022-06-12: 50 mg via INTRAVENOUS

## 2022-06-12 MED ORDER — OXYCODONE HCL 5 MG/5ML PO SOLN: 5.0000 mg | Freq: Once | ORAL | Status: DC | PRN

## 2022-06-12 MED ORDER — ROCURONIUM BROMIDE 10 MG/ML (PF) SYRINGE
PREFILLED_SYRINGE | INTRAVENOUS | Status: AC
  Filled 2022-06-12: qty 20

## 2022-06-12 MED ORDER — LIDOCAINE 2% (20 MG/ML) 5 ML SYRINGE
INTRAMUSCULAR | Status: DC | PRN
  Administered 2022-06-12: 60 mg via INTRAVENOUS

## 2022-06-12 MED ORDER — FENTANYL CITRATE (PF) 100 MCG/2ML IJ SOLN: 25.0000 ug | INTRAMUSCULAR | Status: DC | PRN

## 2022-06-12 MED ORDER — LIDOCAINE HCL (PF) 2 % IJ SOLN
INTRAMUSCULAR | Status: AC
  Filled 2022-06-12: qty 15

## 2022-06-12 MED ORDER — DIPHENHYDRAMINE HCL 50 MG/ML IJ SOLN
INTRAMUSCULAR | Status: AC
  Filled 2022-06-12: qty 1

## 2022-06-12 MED ORDER — SUGAMMADEX SODIUM 200 MG/2ML IV SOLN
INTRAVENOUS | Status: DC | PRN
  Administered 2022-06-12: 150 mg via INTRAVENOUS

## 2022-06-12 MED ORDER — DEXAMETHASONE SODIUM PHOSPHATE 10 MG/ML IJ SOLN
INTRAMUSCULAR | Status: AC
  Filled 2022-06-12: qty 2

## 2022-06-12 SURGICAL SUPPLY — 69 items
APL SKNCLS STERI-STRIP NONHPOA (GAUZE/BANDAGES/DRESSINGS) ×1
BENZOIN TINCTURE PRP APPL 2/3 (GAUZE/BANDAGES/DRESSINGS) ×1 IMPLANT
BLADE EXTENDED COATED 6.5IN (ELECTRODE) ×1 IMPLANT
BLADE SURG 10 STRL SS (BLADE) IMPLANT
BLADE SURG 15 STRL LF DISP TIS (BLADE) ×1 IMPLANT
BLADE SURG 15 STRL SS (BLADE) ×1
BRIEF MESH DISP LRG (UNDERPADS AND DIAPERS) ×1 IMPLANT
COVER BACK TABLE 60X90IN (DRAPES) ×1 IMPLANT
COVER MAYO STAND STRL (DRAPES) ×1 IMPLANT
DRAPE HYSTEROSCOPY (MISCELLANEOUS) IMPLANT
DRAPE LAPAROTOMY 100X72 PEDS (DRAPES) ×1 IMPLANT
DRAPE SHEET LG 3/4 BI-LAMINATE (DRAPES) IMPLANT
DRAPE UTILITY XL STRL (DRAPES) ×1 IMPLANT
ELECT REM PT RETURN 9FT ADLT (ELECTROSURGICAL) ×1
ELECTRODE REM PT RTRN 9FT ADLT (ELECTROSURGICAL) ×1 IMPLANT
GAUZE PAD ABD 8X10 STRL (GAUZE/BANDAGES/DRESSINGS) ×1 IMPLANT
GAUZE SPONGE 4X4 12PLY STRL (GAUZE/BANDAGES/DRESSINGS) ×1 IMPLANT
GLOVE BIO SURGEON STRL SZ7.5 (GLOVE) ×1 IMPLANT
GLOVE INDICATOR 8.0 STRL GRN (GLOVE) ×1 IMPLANT
GOWN STRL REUS W/TWL XL LVL3 (GOWN DISPOSABLE) ×1 IMPLANT
HYDROGEN PEROXIDE 16OZ (MISCELLANEOUS) IMPLANT
IV CATH 14GX2 1/4 (CATHETERS) IMPLANT
IV CATH 18G SAFETY (IV SOLUTION) IMPLANT
KIT SIGMOIDOSCOPE (SET/KITS/TRAYS/PACK) IMPLANT
KIT TURNOVER CYSTO (KITS) ×1 IMPLANT
LEGGING LITHOTOMY PAIR STRL (DRAPES) IMPLANT
LIGASURE 7.4 SM JAW OPEN (ELECTROSURGICAL) IMPLANT
LOOP VASCLR MAXI BLUE 18IN ST (MISCELLANEOUS) IMPLANT
LOOP VASCULAR MAXI 18 BLUE (MISCELLANEOUS)
LOOPS VASCLR MAXI BLUE 18IN ST (MISCELLANEOUS) IMPLANT
NDL HYPO 25X1 1.5 SAFETY (NEEDLE) IMPLANT
NDL SAFETY ECLIP 18X1.5 (MISCELLANEOUS) IMPLANT
NEEDLE HYPO 22GX1.5 SAFETY (NEEDLE) ×1 IMPLANT
NEEDLE HYPO 25X1 1.5 SAFETY (NEEDLE) IMPLANT
NS IRRIG 500ML POUR BTL (IV SOLUTION) ×1 IMPLANT
PACK BASIN DAY SURGERY FS (CUSTOM PROCEDURE TRAY) ×1 IMPLANT
PAD ARMBOARD 7.5X6 YLW CONV (MISCELLANEOUS) IMPLANT
PENCIL SMOKE EVACUATOR (MISCELLANEOUS) ×1 IMPLANT
SPIKE FLUID TRANSFER (MISCELLANEOUS) ×1 IMPLANT
SPONGE HEMORRHOID 8X3CM (HEMOSTASIS) IMPLANT
SPONGE SURGIFOAM ABS GEL 100 (HEMOSTASIS) IMPLANT
SPONGE SURGIFOAM ABS GEL 12-7 (HEMOSTASIS) IMPLANT
SURGILUBE 2OZ TUBE FLIPTOP (MISCELLANEOUS) IMPLANT
SUT CHROMIC 2 0 SH (SUTURE) IMPLANT
SUT CHROMIC 3 0 SH 27 (SUTURE) IMPLANT
SUT MNCRL AB 4-0 PS2 18 (SUTURE) IMPLANT
SUT SILK 0 TIES 10X30 (SUTURE) ×1 IMPLANT
SUT SILK 2 0 (SUTURE)
SUT SILK 2 0 SH (SUTURE) ×1 IMPLANT
SUT SILK 2-0 18XBRD TIE 12 (SUTURE) IMPLANT
SUT VIC AB 2-0 SH 27 (SUTURE) ×1
SUT VIC AB 2-0 SH 27XBRD (SUTURE) IMPLANT
SUT VIC AB 2-0 UR6 27 (SUTURE) ×1 IMPLANT
SUT VIC AB 3-0 SH 18 (SUTURE) IMPLANT
SUT VIC AB 3-0 SH 27 (SUTURE) ×2
SUT VIC AB 3-0 SH 27X BRD (SUTURE) ×1 IMPLANT
SUT VIC AB 3-0 SH 27XBRD (SUTURE) IMPLANT
SUT VIC AB 4-0 P-3 18XBRD (SUTURE) IMPLANT
SUT VIC AB 4-0 P3 18 (SUTURE)
SYR 20ML LL LF (SYRINGE) IMPLANT
SYR BULB EAR ULCER 3OZ GRN STR (SYRINGE) IMPLANT
SYR BULB IRRIG 60ML STRL (SYRINGE) ×1 IMPLANT
SYR CONTROL 10ML LL (SYRINGE) ×1 IMPLANT
SYR TB 1ML LL NO SAFETY (SYRINGE) IMPLANT
TOWEL OR 17X26 10 PK STRL BLUE (TOWEL DISPOSABLE) ×1 IMPLANT
TRAY DSU PREP LF (CUSTOM PROCEDURE TRAY) ×1 IMPLANT
TUBE CONNECTING 12X1/4 (SUCTIONS) ×1 IMPLANT
VASCULAR TIE MAXI BLUE 18IN ST (MISCELLANEOUS)
YANKAUER SUCT BULB TIP NO VENT (SUCTIONS) ×1 IMPLANT

## 2022-06-12 NOTE — Op Note (Addendum)
06/12/2022  11:36 AM  PATIENT:  Connie Olson  67 y.o. female  Patient Care Team: Audley Hose, MD as PCP - General (Internal Medicine)  PRE-OPERATIVE DIAGNOSIS:  Anal canal polypoid lesion  POST-OPERATIVE DIAGNOSIS:  Anterior midline anal canal polypoid lesion  PROCEDURE:   Trans-anal excision of anal canal polypoid lesion - anterior midline under anoscopy Anorectal exam under anesthesia  SURGEON: Nadeen Landau, MD  ASSISTANT: Roma Schanz MD PhD PGY-7  ANESTHESIA:   local and general  SPECIMEN:  Anterior midline polypoid lesion  DISPOSITION OF SPECIMEN:  PATHOLOGY  COUNTS:  Sponge, needle, and instrument counts were reported correct x2 at conclusion.  EBL: 5 mL  Drains: None  PLAN OF CARE: Discharge to home after PACU  PATIENT DISPOSITION:  PACU - hemodynamically stable.  OR FINDINGS: Anterior midline anal canal polypoid lesion originating just above the dentate line with verrucous type changes at the distalmost aspect of the prolapsing material.  This may in fact be chronic changes related to prolapsing tissue.  It is soft and mobile.  This was fully excised.  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   DESCRIPTION: The patient was identified in the preoperative holding area and taken to the OR. SCDs were applied.  She then underwent general endotracheal anesthesia without difficulty. The patient was then rolled onto the OR table in the prone jackknife position. Pressure points were then evaluated and padded. Benzoin was applied to the buttocks and they were gently taped apart.  She was then prepped and draped in usual sterile fashion.  A surgical timeout was performed indicating the correct patient, procedure, and positioning.  A perianal block was then created using a dilute mixture of 0.25% Marcaine with epinephrine and Exparel.  After ascertaining an appropriate level of anesthesia had been achieved, a well  lubricated digital rectal exam was performed. This demonstrated no palpable masses.  A Hill-Ferguson anoscope was into the anal canal and circumferential inspection demonstrated polypoid-like lesion in the anterior midline.  The rest of the anal canal and distal most aspect of the rectum are normal in appearance.  The mucosa is normal in appearance.  There is no ulceration.  There are no evident fissures.  No significant hemorrhoids.  Anterior midline lesion is reevaluated.  With a Hill-Ferguson in place, this lesion is grasped and elevated.  Margins of excision are marked such that it is all fully excised.  It is soft and mobile.  This is then incised sharply and dissected free of the underlying sphincter muscle.  No muscle was divided during this excision.  The mucosal portion is excised using electrocautery.  This was then passed off the specimen.  Hemostasis was achieved with electrocautery.  A 2-0 Vicryl figure-of-eight suture was placed at the apex of the excision site.  The excision defect is then closed using a running 3-0 Vicryl suture.  The anal canal was irrigated.  Hemostasis is verified.  All sponge, needle, and instrument counts were reported correct.  The buttocks are untaped and a dressing consisting of 4 x 4's, ABD, and mesh underwear was placed.  She is then rolled back onto the stretcher, awakened anesthesia, extubated, and transported to the recovery room in satisfactory condition.  DISPOSITION: PACU in satisfactory condition.

## 2022-06-12 NOTE — H&P (Signed)
CC: Here today for surgery  HPI: Connie Olson is an 67 y.o. female with history of HTN, HLD, whom is seen in the office today as a referral by Dr. Collene Olson for evaluation of rectal polyp.  She underwent colonoscopy with Dr. Collene Olson 04/26/2022 and we have a color eyes copy of this today: - Polyp at anal verge prolapsing from the rectum -Extensive diverticulosis in the entire examined colon; there was no evidence of diverticular bleeding. 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 No viral cytopathic effect, ischemic necrosis, dysplasia or malignancy is identified.  She reports she is noted a small prolapsing piece of tissue in the perianal area for the last couple of months. She has no pain with defecation. This has not been painful for her at all. She does rarely note some blood on the tissue paper when wiping. She denies any history of anorectal surgeries or procedures. She denies any issues with constipation or diarrhea. Generally has a soft formed bowel movement daily.  PMH: HTN, HLD  PSH: Hysterectomy  FHx: Denies any known family history of colorectal, breast, endometrial or ovarian cancer  Social Hx: Denies use of tobacco/illicit drug. 2 glasses of wine in the evening. She works from [employer].   Past Medical History:  Diagnosis Date   Hypercholesteremia    Hypertension     Past Surgical History:  Procedure Laterality Date   ABDOMINAL HYSTERECTOMY      History reviewed. No pertinent family history.  Social:  reports that she quit smoking about 32 years ago. Her smoking use included cigarettes. She  does not have any smokeless tobacco history on file. She reports current alcohol use. She reports that she does not use drugs.  Allergies: No Known Allergies  Medications: I have reviewed the patient's current medications.  No results found for this or any previous visit (from the past 48 hour(s)).  No results found.  ROS - all of the below systems have been reviewed with the patient and positives are indicated with bold text General: chills, fever or night sweats Eyes: blurry vision or double vision ENT: epistaxis or sore throat Allergy/Immunology: itchy/watery eyes or nasal congestion Hematologic/Lymphatic: bleeding problems, blood clots or swollen lymph nodes Endocrine: temperature intolerance or unexpected weight changes Breast: new or changing breast lumps or nipple discharge Resp: cough, shortness of breath, or wheezing CV: chest pain or dyspnea on exertion GI: as per HPI GU: dysuria, trouble voiding, or hematuria MSK: joint pain or joint stiffness Neuro: TIA or stroke symptoms Derm: pruritus and skin lesion changes Psych: anxiety and depression  PE Blood pressure (!) 181/78, pulse 79, temperature 97.7 F (36.5 C), temperature source Oral, resp. rate 15, height 5\' 7"  (1.702 m), weight 61.3 kg, SpO2 100 %. Constitutional: NAD; conversant Eyes: Moist conjunctiva Neck: Trachea midline Lungs: Normal respiratory effort CV: RRR GI: Abd soft, NT/ND MSK: Normal range of motion of extremities Psychiatric: Appropriate affect  No results found for this or any previous visit (from the past 48 hour(s)).  No results found.  A/P: Connie Olson is an 67 y.o. female with hx of HTN, HLD here for evaluation of prolapsing polypoid lesion in the distal  rectum  -This lesion certainly has a polypoid type appearance. We discussed that this could be related to chronic prolapse type changes from something like an internal hemorrhoid or actually a distal rectal polyp. We discussed  potential sampling error is associated with biopsies and given its appearance, discussed removal. -The anatomy and physiology of the anal canal was discussed with the patient with associated pictures. The pathophysiology of anorectal polyps/polypoid lesions was discussed at length with associated pictures and illustrations. -We have reviewed options going forward including further observation vs surgery -transanal excision of distal rectal polypoid lesion under anoscopy; anorectal exam under anesthesia -The planned procedure, material risks (including, but not limited to, pain, bleeding, infection, scarring, need for blood transfusion, damage to anal sphincter, incontinence of gas and/or stool, need for additional procedures, anal stenosis, rare cases of pelvic sepsis which in severe cases may require things like a colostomy, recurrence, pneumonia, heart attack, stroke, death) benefits and alternatives to surgery were discussed at length. I noted a good probability that the procedure would help improve their symptoms. The patient's questions were answered to her satisfaction, she voiced understanding and elected to proceed with surgery. Additionally, we discussed typical postoperative expectations and the recovery process.   Connie Olson, [facility], [place]

## 2022-06-12 NOTE — Transfer of Care (Signed)
Immediate Anesthesia Transfer of Care Note  Patient: Connie Olson  Procedure(s) Performed: TRANSANAL EXCISION OF DISTAL RECTAL POLYPOID LESION (Rectum) ANORECTAL EXAM UNDER ANESTHESIA (Rectum)  Patient Location: PACU  Anesthesia Type:General  Level of Consciousness: drowsy and patient cooperative  Airway & Oxygen Therapy: Patient Spontanous Breathing  Post-op Assessment: Report given to RN and Post -op Vital signs reviewed and stable  Post vital signs: Reviewed and stable  Last Vitals:  Vitals Value Taken Time  BP 143/79 06/12/22 1137  Temp    Pulse 65 06/12/22 1140  Resp 14 06/12/22 1140  SpO2 95 % 06/12/22 1140  Vitals shown include unvalidated device data.  Last Pain:  Vitals:   06/12/22 0856  TempSrc: Oral  PainSc: 0-No pain      Patients Stated Pain Goal: 5 (44/03/47 4259)  Complications: No notable events documented.

## 2022-06-12 NOTE — Anesthesia Postprocedure Evaluation (Signed)
Anesthesia Post Note  Patient: Connie Olson  Procedure(s) Performed: TRANSANAL EXCISION OF DISTAL RECTAL POLYPOID LESION (Rectum) ANORECTAL EXAM UNDER ANESTHESIA (Rectum)     Patient location during evaluation: PACU Anesthesia Type: General Level of consciousness: awake and alert Pain management: pain level controlled Vital Signs Assessment: post-procedure vital signs reviewed and stable Respiratory status: spontaneous breathing, nonlabored ventilation and respiratory function stable Cardiovascular status: blood pressure returned to baseline Postop Assessment: no apparent nausea or vomiting Anesthetic complications: no   No notable events documented.  Last Vitals:  Vitals:   06/12/22 1145 06/12/22 1200  BP: 132/67 (!) 150/72  Pulse: (!) 57 (!) 59  Resp: 14 12  Temp:    SpO2: 93% 98%    Last Pain:  Vitals:   06/12/22 1200  TempSrc:   PainSc: 0-No pain                 Marthenia Rolling

## 2022-06-12 NOTE — Anesthesia Procedure Notes (Signed)
Procedure Name: Intubation Date/Time: 06/12/2022 10:41 AM  Performed by: Clearnce Sorrel, CRNAPre-anesthesia Checklist: Patient identified, Emergency Drugs available, Suction available and Patient being monitored Patient Re-evaluated:Patient Re-evaluated prior to induction Oxygen Delivery Method: Circle System Utilized Preoxygenation: Pre-oxygenation with 100% oxygen Induction Type: IV induction Ventilation: Mask ventilation without difficulty Laryngoscope Size: Mac and 3 Grade View: Grade I Tube type: Oral Number of attempts: 1 Airway Equipment and Method: Stylet and Oral airway Placement Confirmation: ETT inserted through vocal cords under direct vision, positive ETCO2 and breath sounds checked- equal and bilateral Secured at: 22 cm Tube secured with: Tape Dental Injury: Teeth and Oropharynx as per pre-operative assessment

## 2022-06-12 NOTE — Discharge Instructions (Addendum)
ANORECTAL SURGERY: POST OP INSTRUCTIONS  DIET: Follow a light bland diet the first 24 hours after arrival home, such as soup, liquids, crackers, etc.  Be sure to include lots of fluids daily.  Avoid fast food or heavy meals as your are more likely to get nauseated.  Eat a low fat diet the next few days after surgery.   Some bleeding with bowel movements is expected for the first couple of days but this should stop in between bowel movements  Take your usually prescribed home medications unless otherwise directed. No foreign bodies per rectum for the next 3 months (enemas, etc)  PAIN CONTROL: It is helpful to take an over-the-counter pain medication regularly for the first few days/weeks.  Choose from the following that works best for you: Ibuprofen (Advil, etc) Three 200mg  tabs every 6 hours as needed. Acetaminophen (Tylenol, etc) 500-650mg  every 6 hours as needed NOTE: You may take both of these medications together - most patients find it most helpful when alternating between the two (i.e. Ibuprofen at 6am, tylenol at 9am, ibuprofen at 12pm ..Marland Kitchen) A  prescription for pain medication may have been prescribed for you at discharge.  Take your pain medication as prescribed.  If you are having problems/concerns with the prescription medicine, please call us for further advice.  Avoid getting constipated.  Between the surgery and the pain medications, it is common to experience some constipation.  Increasing fluid intake (64oz of water per day) and taking a fiber supplement (such as Metamucil, Citrucel, FiberCon) 1-2 times a day regularly will usually help prevent this problem from occurring.  Take Miralax (over the counter) 1-2x/day while taking a narcotic pain medication. If no bowel movement after 48hours, you may additionally take a laxative like a bottle of Milk of Magnesia which can be purchased over the counter. Avoid enemas.   Watch out for diarrhea.  If you have many loose bowel movements,  simplify your diet to bland foods.  Stop any stool softeners and decrease your fiber supplement. If this worsens or does not improve, please call us.  Wash / shower every day.  If you were discharged with a dressing, you may remove this the day after your surgery. You may shower normally, getting soap/water on your wound, particularly after bowel movements.  Soaking in a warm bath filled a couple inches ("Sitz bath") is a great way to clean the area after a bowel movement and many patients find it is a way to soothe the area.  ACTIVITIES as tolerated:   You may resume regular (light) daily activities beginning the next day--such as daily self-care, walking, climbing stairs--gradually increasing activities as tolerated.  If you can walk 30 minutes without difficulty, it is safe to try more intense activity such as jogging, treadmill, bicycling, low-impact aerobics, etc. Refrain from any heavy lifting or straining for the first 2 weeks after your procedure, particularly if your surgery was for hemorrhoids. Avoid activities that make your pain worse You may drive when you are no longer taking prescription pain medication, you can comfortably wear a seatbelt, and you can safely maneuver your car and apply brakes.  FOLLOW UP in our office Please call [facility] at [phone] to set up an appointment to see your surgeon in the office for a follow-up appointment approximately 2 weeks after your surgery. Make sure that you call for this appointment the day you arrive home to insure a convenient appointment time.  9. If you have disability or family leave forms  that need to be completed, you may have them completed by your primary care physician's office; for return to work instructions, please ask our office staff and they will be happy to assist you in obtaining this documentation   When to call us [phone]: Poor pain control Reactions / problems with new medications (rash/itching, etc)  Fever over  101.5 F (38.5 C) Inability to urinate Nausea/vomiting Worsening swelling or bruising Continued bleeding from incision. Increased pain, redness, or drainage from the incision  The clinic staff is available to answer your questions during regular business hours (8:30am-5pm).  Please don't hesitate to call and ask to speak to one of our nurses for clinical concerns.   A surgeon from [facility] is always on call at the hospitals   If you have a medical emergency, go to the nearest emergency room or call 911.   [facility] [address] [place], [place], [place]  [address] MAIN: [phone] FAX: [phone] [email or web address]      No acetaminophen/Tylenol until after 3:00 pm today if needed.    Information for Discharge Teaching: EXPAREL (bupivacaine liposome injectable suspension)   Your surgeon or anesthesiologist gave you EXPAREL(bupivacaine) to help control your pain after surgery.  EXPAREL is a local anesthetic that provides pain relief by numbing the tissue around the surgical site. EXPAREL is designed to release pain medication over time and can control pain for up to 72 hours. Depending on how you respond to EXPAREL, you may require less pain medication during your recovery.  Possible side effects: Temporary loss of sensation or ability to move in the area where bupivacaine was injected. Nausea, vomiting, constipation Rarely, numbness and tingling in your mouth or lips, lightheadedness, or anxiety may occur. Call your doctor right away if you think you may be experiencing any of these sensations, or if you have other questions regarding possible side effects.  Follow all other discharge instructions given to you by your surgeon or nurse. Eat a healthy diet and drink plenty of water or other fluids.  If you return to the hospital for any reason within 96 hours following the administration of  EXPAREL, it is important for health care providers to know that you have received this anesthetic. A teal colored band has been placed on your arm with the date, time and amount of EXPAREL you have received in order to alert and inform your health care providers. Please leave this armband in place for the full 96 hours following administration, and then you may remove the band.    Post Anesthesia Home Care Instructions  Activity: Get plenty of rest for the remainder of the day. A responsible individual must stay with you for 24 hours following the procedure.  For the next 24 hours, DO NOT: -Drive a car -Paediatric nurse -Drink alcoholic beverages -Take any medication unless instructed by your physician -Make any legal decisions or sign important papers.  Meals: Start with liquid foods such as gelatin or soup. Progress to regular foods as tolerated. Avoid greasy, spicy, heavy foods. If nausea and/or vomiting occur, drink only clear liquids until the nausea and/or vomiting subsides. Call your physician if vomiting continues.  Special Instructions/Symptoms: Your throat may feel dry or sore from the anesthesia or the breathing tube placed in your throat during surgery. If this causes discomfort, gargle with warm salt water. The discomfort should disappear within 24 hours.

## 2022-06-13 ENCOUNTER — Encounter (HOSPITAL_BASED_OUTPATIENT_CLINIC_OR_DEPARTMENT_OTHER): Payer: Self-pay | Admitting: Surgery

## 2022-06-13 LAB — SURGICAL PATHOLOGY

## 2022-06-29 ENCOUNTER — Ambulatory Visit
Admission: RE | Admit: 2022-06-29 | Discharge: 2022-06-29 | Disposition: A | Discharge status: Home / Self Care | Payer: Medicare HMO | Source: Ambulatory Visit | Attending: Internal Medicine | Admitting: Internal Medicine

## 2022-06-29 DIAGNOSIS — Z1231 Encounter for screening mammogram for malignant neoplasm of breast: Secondary | ICD-10-CM

## 2022-07-04 ENCOUNTER — Other Ambulatory Visit: Payer: Self-pay | Admitting: Internal Medicine

## 2022-07-04 DIAGNOSIS — R928 Other abnormal and inconclusive findings on diagnostic imaging of breast: Secondary | ICD-10-CM

## 2022-07-10 ENCOUNTER — Encounter: Payer: Self-pay | Admitting: *Deleted

## 2022-07-13 ENCOUNTER — Ambulatory Visit: Admission: RE | Admit: 2022-07-13 | Payer: Medicare HMO | Source: Ambulatory Visit

## 2022-07-13 ENCOUNTER — Ambulatory Visit
Admission: RE | Admit: 2022-07-13 | Discharge: 2022-07-13 | Disposition: A | Discharge status: Home / Self Care | Payer: Medicare HMO | Source: Ambulatory Visit | Attending: Internal Medicine | Admitting: Internal Medicine

## 2022-07-13 DIAGNOSIS — R928 Other abnormal and inconclusive findings on diagnostic imaging of breast: Secondary | ICD-10-CM

## 2022-08-28 ENCOUNTER — Ambulatory Visit: Payer: Medicare HMO | Admitting: Obstetrics and Gynecology

## 2022-08-28 ENCOUNTER — Encounter: Payer: Self-pay | Admitting: Obstetrics and Gynecology

## 2022-08-28 VITALS — BP 178/98 | HR 76 | Ht 65.5 in | Wt 137.0 lb

## 2022-08-28 DIAGNOSIS — N993 Prolapse of vaginal vault after hysterectomy: Secondary | ICD-10-CM | POA: Diagnosis not present

## 2022-08-28 DIAGNOSIS — R350 Frequency of micturition: Secondary | ICD-10-CM

## 2022-08-28 DIAGNOSIS — N816 Rectocele: Secondary | ICD-10-CM

## 2022-08-28 DIAGNOSIS — N8110 Cystocele, unspecified: Secondary | ICD-10-CM

## 2022-08-28 LAB — POCT URINALYSIS DIPSTICK
Bilirubin, UA: NEGATIVE
Blood, UA: NEGATIVE
Glucose, UA: NEGATIVE
Ketones, UA: NEGATIVE
Leukocytes, UA: NEGATIVE
Nitrite, UA: NEGATIVE
Protein, UA: NEGATIVE
Spec Grav, UA: 1.025 (ref 1.010–1.025)
Urobilinogen, UA: 0.2 E.U./dL
pH, UA: 7 (ref 5.0–8.0)

## 2022-08-28 NOTE — Progress Notes (Signed)
Connie Olson New Patient Evaluation and Consultation  Referring Provider: Andria Meuse, MD PCP: Harvest [place], MD Date of Service: 08/28/2022  SUBJECTIVE Chief Complaint: New Patient (Initial Visit) Connie Olson is a 67 y.o. female here for a consult for prolapse./)  History of Present Illness: Connie Olson is a 67 y.o. White or Caucasian female seen in consultation at the request of Dr. Cliffton Asters for evaluation of prolapse.    Review of records significant for: Pt had anal polyp removed. Noted prolapse at time of surgery  Urinary Symptoms: Does not leak urine.   Day time voids 5+.  Nocturia: 1 times per night to void. Voiding dysfunction: Connie Olson does not empty her bladder well.  does not use a catheter to empty bladder.  When urinating, Connie Olson feels Connie Olson has no difficulties  UTIs:  0  UTI's in the last year.   Denies history of blood in urine and kidney or bladder stones  Pelvic Organ Prolapse Symptoms:                  Admits to a feeling of a bulge the vaginal area. It has been present for a few years.  Admits to seeing a bulge.  This bulge is bothersome.  Bowel Symptom: Bowel movements: 1-3 time(s) per day Stool consistency: soft  Straining: no.  Splinting: no.  Incomplete evacuation: no.  Connie Olson Denies accidental bowel leakage / fecal incontinence Bowel regimen: none   Sexual Function Sexually active: no.   Pelvic Pain Denies pelvic pain   Past Medical History:  Past Medical History:  Diagnosis Date   Hypercholesteremia    Hypertension      Past Surgical History:   Past Surgical History:  Procedure Laterality Date   RECTAL EXAM UNDER ANESTHESIA N/A 06/12/2022   Procedure: ANORECTAL EXAM UNDER ANESTHESIA;  Surgeon: Andria Meuse, MD;  Location: [facility];  Service: General;  Laterality: N/A;   TRANSANAL EXCISION OF RECTAL MASS N/A 06/12/2022   Procedure: TRANSANAL EXCISION OF DISTAL RECTAL POLYPOID LESION;   Surgeon: Andria Meuse, MD;  Location: [facility];  Service: General;  Laterality: N/A;   VAGINAL HYSTERECTOMY       Past OB/GYN History: OB History  Gravida Para Term Preterm AB Living  4       1 2   SAB IAB Ectopic Multiple Live Births  1       3    # Outcome Date GA Lbr Len/2nd Weight Sex Delivery Anes PTL Lv  4 Gravida           3 Gravida           2 Gravida           1 SAB             Vaginal deliveries: 3- forceps with one delivery, Cesarean section: 0 S/p hysterectomy   Medications: Connie Olson has a current medication list which includes the following prescription(s): vitamin d3, losartan, and rosuvastatin.   Allergies: Patient has No Known Allergies.   Social History:  Social History   Tobacco Use   Smoking status: Former    Types: Cigarettes    Quit date: 07/27/1989    Years since quitting: 33.1  Vaping Use   Vaping Use: Never used  Substance Use Topics   Alcohol use: Yes    Alcohol/week: 0.0 standard drinks of alcohol   Drug use: Never    Relationship status: married Connie Olson lives with husband.  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 Regular exercise: No History of abuse: No  Family History:   Family History  Problem Relation Age of Onset   Hypertension Mother    Cancer Mother      Review of Systems: Review of Systems  Constitutional:  Negative for fever, malaise/fatigue and weight loss.  Respiratory:  Negative for cough, shortness of breath and wheezing.   Cardiovascular:  Negative for chest pain, palpitations and leg swelling.  Gastrointestinal:  Negative for abdominal pain and blood in stool.  Genitourinary:  Negative for dysuria.  Musculoskeletal:  Negative for myalgias.  Skin:  Negative for rash.  Neurological:  Negative for dizziness and headaches.  Endo/Heme/Allergies:  Does not bruise/bleed easily.  Psychiatric/Behavioral:  Negative for depression. The patient is not nervous/anxious.      OBJECTIVE Physical Exam: Vitals:    08/28/22 1311  BP: (!) 194/83  Pulse: 81  Weight: 137 lb (62.1 kg)  Height: 5' 5.5" (1.664 m)    Physical Exam Constitutional:      General: Connie Olson is not in acute distress. Pulmonary:     Effort: Pulmonary effort is normal.  Abdominal:     General: There is no distension.     Palpations: Abdomen is soft.     Tenderness: There is no abdominal tenderness. There is no rebound.  Musculoskeletal:        General: No swelling. Normal range of motion.  Skin:    General: Skin is warm and dry.     Findings: No rash.  Neurological:     Mental Status: Connie Olson is alert and oriented to person, place, and time.  Psychiatric:        Mood and Affect: Mood normal.        Behavior: Behavior normal.      GU / Detailed Urogynecologic Evaluation:  Pelvic Exam: Normal external female genitalia; Bartholin's and Skene's glands normal in appearance; urethral meatus normal in appearance, no urethral masses or discharge.   CST: negative   s/p hysterectomy: Speculum exam reveals normal vaginal mucosa with  atrophy and normal vaginal cuff.  Adnexa no mass, fullness, tenderness.    Pelvic floor strength II/V  Pelvic floor musculature: Right levator non-tender, Right obturator non-tender, Left levator non-tender, Left obturator non-tender  POP-Q:   POP-Q  -1                                            Aa   -1                                           Ba  3                                              C   3.5                                            Gh  3  Pb  7                                            tvl   -2                                            Ap  -2                                            Bp                                                 D      Rectal Exam:  Normal external rectum  Post-Void Residual (PVR) by Bladder Scan: In order to evaluate bladder emptying, we discussed obtaining a postvoid residual and Connie Olson agreed to  this procedure.  Procedure: The ultrasound unit was placed on the patient's abdomen in the suprapubic region after the patient had voided. A PVR of 0 ml was obtained by bladder scan.  Laboratory Results: POC urine: negative   ASSESSMENT AND PLAN Connie Olson is a 67 y.o. 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 Prolapse of anterior vaginal wall   3. Prolapse of posterior vaginal wall   4. Urinary frequency    Stage II anterior, Stage I posterior, Stage III apical prolapse  -For treatment of pelvic organ prolapse, we discussed options for management including expectant management, conservative management, and surgical management, such as Kegels, a pessary, pelvic floor physical therapy, and specific surgical procedures. - Connie Olson is mainly interested in trying a pessary but is unsure it is something Connie Olson wants long term. We discussed two options for prolapse repair:  1) vaginal repair without mesh - Pros - safer, no mesh complications - Cons - not as strong as mesh repair, higher risk of recurrence  2) laparoscopic repair with mesh - Pros - stronger, better long-term success - Cons - risks of mesh implant (erosion into vagina or bladder, adhering to the rectum, pain) - these risks are lower than with a vaginal mesh but still exist - Handouts provided on vaginal sacrospinous fixation (+/- A&P repair) and sacrocolpopexy - Recommend urodynamics if Connie Olson decides Connie Olson wants surgery to assess for occult incontinence  Connie Olson will return for a pessary fitting  Marguerita Beards, MD

## 2022-09-12 ENCOUNTER — Telehealth: Payer: Self-pay | Admitting: Obstetrics and Gynecology

## 2022-09-12 NOTE — Telephone Encounter (Signed)
FYI- patient decided not to do pessary, but will call later in the year to discuss doing surgery.  She has a lot going on right now.  She will call us back, and she has a cold right now too.

## 2022-09-14 ENCOUNTER — Ambulatory Visit: Payer: Medicare HMO | Admitting: Obstetrics and Gynecology

## 2023-04-03 ENCOUNTER — Other Ambulatory Visit: Payer: Self-pay | Admitting: Internal Medicine

## 2023-04-03 DIAGNOSIS — Z1231 Encounter for screening mammogram for malignant neoplasm of breast: Secondary | ICD-10-CM

## 2023-06-25 ENCOUNTER — Other Ambulatory Visit: Payer: Self-pay | Admitting: Internal Medicine

## 2023-06-25 DIAGNOSIS — Z13820 Encounter for screening for osteoporosis: Secondary | ICD-10-CM

## 2023-07-02 ENCOUNTER — Ambulatory Visit
Admission: RE | Admit: 2023-07-02 | Discharge: 2023-07-02 | Disposition: A | Discharge status: Home / Self Care | Payer: Medicare Other | Source: Ambulatory Visit | Attending: Internal Medicine | Admitting: Internal Medicine

## 2023-07-02 DIAGNOSIS — Z1231 Encounter for screening mammogram for malignant neoplasm of breast: Secondary | ICD-10-CM

## 2023-09-21 DIAGNOSIS — E785 Hyperlipidemia, unspecified: Secondary | ICD-10-CM | POA: Diagnosis not present

## 2023-09-21 DIAGNOSIS — R252 Cramp and spasm: Secondary | ICD-10-CM | POA: Diagnosis not present

## 2023-09-21 DIAGNOSIS — R7303 Prediabetes: Secondary | ICD-10-CM | POA: Diagnosis not present

## 2023-09-28 DIAGNOSIS — R638 Other symptoms and signs concerning food and fluid intake: Secondary | ICD-10-CM | POA: Diagnosis not present

## 2023-09-28 DIAGNOSIS — R635 Abnormal weight gain: Secondary | ICD-10-CM | POA: Diagnosis not present

## 2023-09-28 DIAGNOSIS — I10 Essential (primary) hypertension: Secondary | ICD-10-CM | POA: Diagnosis not present

## 2023-09-28 DIAGNOSIS — E785 Hyperlipidemia, unspecified: Secondary | ICD-10-CM | POA: Diagnosis not present

## 2024-02-15 ENCOUNTER — Other Ambulatory Visit: Payer: Medicare Other

## 2024-05-21 ENCOUNTER — Other Ambulatory Visit: Payer: Self-pay | Admitting: Internal Medicine

## 2024-05-21 DIAGNOSIS — K760 Fatty (change of) liver, not elsewhere classified: Secondary | ICD-10-CM

## 2024-05-27 ENCOUNTER — Other Ambulatory Visit (HOSPITAL_BASED_OUTPATIENT_CLINIC_OR_DEPARTMENT_OTHER)

## 2024-05-30 ENCOUNTER — Ambulatory Visit
Admission: RE | Admit: 2024-05-30 | Discharge: 2024-05-30 | Disposition: A | Source: Ambulatory Visit | Attending: Internal Medicine | Admitting: Internal Medicine

## 2024-05-30 DIAGNOSIS — K760 Fatty (change of) liver, not elsewhere classified: Secondary | ICD-10-CM

## 2024-06-24 ENCOUNTER — Other Ambulatory Visit: Payer: Self-pay | Admitting: Internal Medicine

## 2024-06-24 DIAGNOSIS — Z1231 Encounter for screening mammogram for malignant neoplasm of breast: Secondary | ICD-10-CM

## 2024-07-04 ENCOUNTER — Ambulatory Visit

## 2024-07-28 ENCOUNTER — Ambulatory Visit: Admitting: Dermatology
